# Patient Record
Sex: Female | Born: 1975 | Race: White | Hispanic: No | Smoking: Current every day smoker
Health system: Southern US, Community
[De-identification: ages and names within clinical notes are randomized; demographics above are authoritative.]

## PROBLEM LIST (undated history)

## (undated) DIAGNOSIS — F329 Major depressive disorder, single episode, unspecified: Secondary | ICD-10-CM

## (undated) DIAGNOSIS — F32A Depression, unspecified: Secondary | ICD-10-CM

## (undated) DIAGNOSIS — R51 Headache: Secondary | ICD-10-CM

## (undated) HISTORY — PX: TUBAL LIGATION: SHX77

## (undated) HISTORY — DX: Depression, unspecified: F32.A

## (undated) HISTORY — PX: OTHER SURGICAL HISTORY: SHX169

## (undated) HISTORY — DX: Major depressive disorder, single episode, unspecified: F32.9

---

## 2002-02-05 ENCOUNTER — Ambulatory Visit (HOSPITAL_COMMUNITY): Admission: RE | Admit: 2002-02-05 | Discharge: 2002-02-05 | Payer: Self-pay | Admitting: Gastroenterology

## 2005-01-14 ENCOUNTER — Ambulatory Visit (HOSPITAL_COMMUNITY): Admission: RE | Admit: 2005-01-14 | Discharge: 2005-01-14 | Payer: Self-pay | Admitting: Orthopedic Surgery

## 2005-01-14 ENCOUNTER — Ambulatory Visit (HOSPITAL_BASED_OUTPATIENT_CLINIC_OR_DEPARTMENT_OTHER): Admission: RE | Admit: 2005-01-14 | Discharge: 2005-01-14 | Payer: Self-pay | Admitting: Orthopedic Surgery

## 2008-08-15 ENCOUNTER — Ambulatory Visit (HOSPITAL_BASED_OUTPATIENT_CLINIC_OR_DEPARTMENT_OTHER): Admission: RE | Admit: 2008-08-15 | Discharge: 2008-08-15 | Payer: Self-pay | Admitting: Orthopedic Surgery

## 2010-07-10 NOTE — Op Note (Signed)
Becky Friedman, Becky Friedman                ACCOUNT NO.:  [id number]   MEDICAL RECORD NO.:  [id number]          PATIENT TYPE:  AMB   LOCATION:  DSC                          FACILITY:  [facility]   PHYSICIAN:  Feliberto Gottron. Turner Daniels, M.D.   DATE OF BIRTH:  08/19/1975   DATE OF PROCEDURE:  08/15/2008  DATE OF DISCHARGE:                               OPERATIVE REPORT   PREOPERATIVE DIAGNOSIS:  Right knee lateral meniscal tear.   POSTOPERATIVE DIAGNOSIS:  Right knee symptomatic plica.   PROCEDURE:  Right knee arthroscopic excision of plica.   SURGEON:  Feliberto Gottron. Turner Daniels, MD   FIRST ASSISTANT:  Shirl Harris, PA-C.   ANESTHETIC:  General LMA.   ESTIMATED BLOOD LOSS:  Minimal.   FLUID REPLACEMENT:  500 mL of crystalloid.   DRAINS PLACED:  None.   TOURNIQUET TIME:  None.   INDICATIONS FOR PROCEDURE:  The patient is a 35 year old woman with  symptomatic catching, popping, and pain in her right knee.  She has  failed conservative treatment with antiinflammatory medicines, attempts  at physical therapy and exercise.  Unfortunately, she has persistent  pain, catching, and popping in her right knee.  She desires arthroscopic  evaluation and treatment of same.  Risks and benefits of surgery have  been discussed and questions are answered.   DESCRIPTION OF PROCEDURE:  The patient was identified by armband and  taken to the operating room #2 at [facility].  Appropriate  anesthetic monitors were attached and general LMA anesthesia was induced  with the patient in the supine position.  Lateral post was applied to  the table and right lower extremity was prepped and draped in the usual  sterile fashion from the ankle to the mid thigh.  We then performed a  standard time-out procedure.  Using a #11 blade, standard inferomedial  and inferolateral peripatellar portals were then made allowing  introduction of the arthroscope through the inferolateral portal and the  outflow through the inferomedial  portal.  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  There was no evidence of any  significant chondromalacia or flap tears or any significant loose  bodies.  The gutters were cleared medially and laterally and the scope  was taken through the notch clearing the posterior compartments as well.  At this point, the knee was irrigated out with normal saline solution  and using a 3.5 Gator sucker shaver the superomedial plica was removed  and photographic documentation was made of this before and after the  removal of the plica.  The knee was again irrigated out with normal  saline solution.  The arthroscopic instruments were removed and a  dressing of Xeroform, 4 x 4 dressing  sponges, Webril, and Ace wrap was applied.  The patient was then  awakened, extubated, and taken to the recovery room without difficulty  and prior placement of the dressing a 5-10  mL of 0.5% Marcaine solution  were injected into the knee and another 2-3 mL in each portal.      Feliberto Gottron. Turner Daniels, M.D.  Electronically Signed     FJR/MEDQ  D:  08/15/2008  T:  08/16/2008  Job:  [id number]

## 2010-07-13 NOTE — Op Note (Signed)
   [name], PATIENT                            ACCOUNT NO.:  [id number]   MEDICAL RECORD NO.:  [id number]                   PATIENT TYPE:  AMB   LOCATION:  ENDO                                 FACILITY:  [facility]   PHYSICIAN:  Anselmo Rod, M.D.               DATE OF BIRTH:  Nov 27, 1975   DATE OF PROCEDURE:  02/05/2002  DATE OF DISCHARGE:                                 OPERATIVE REPORT   PROCEDURE:  Colonoscopy.   ENDOSCOPIST:  Charna Elizabeth, M.D.   INSTRUMENT USED:  Pediatric adjustable Olympus colonoscope.   INDICATIONS FOR PROCEDURE:  Rectal bleeding in a 35 year old white female  with change in bowel habits.  Rule out IBD.   PROCEDURE PERFORMED:  Informed consent was procured from the patient.  The  patient fasted for eight hours prior to the procedure and prepped with a  bottle of MiraLax and a bottle of Gatorade the night prior to the procedure.   PREPROCEDURE PHYSICAL EXAMINATION:  VITAL SIGNS:  The patient had stable  vital signs.  NECK:  Supple.  CHEST:  Clear to auscultation.  HEART:  S1 and S2 regular.  ABDOMEN:  Soft with normal bowel sounds.   DESCRIPTION OF PROCEDURE:  The patient was placed in the left lateral  decubitus position, sedated with 50 mg of Demerol and 5 mg of Versed  intravenously.  Once the patient was adequately sedated and maintained on  low flow oxygen and continuous cardiac monitoring, the Olympus video  colonoscope was advanced from the rectum to the cecum and terminal ileum.  A  few left-sided diverticula were seen.  Small internal hemorrhoids were seen  on retroflexion.  The rest of the colonic mucosa to the terminal ileum  appeared normal.  There was some residual stool in the colon.  Multiple  washings were done.   IMPRESSION:  1. Normal colon up to the terminal ileum except for nonbleeding internal     hemorrhoids.  2. A couple of left-sided diverticula were seen.  3. No evidence of inflammatory bowel disease.     RECOMMENDATIONS:  1. High fiber diet with liberal fluids has been advocated.  2. Outpatient follow up in the next two weeks for further recommendations.                                               Anselmo Rod, M.D.    JNM/MEDQ  D:  02/05/2002  T:  02/06/2002  Job:  [id number]   cc:   [facility]  [address]  [place]  [place] [address]  Fax: [phone]   Blenda Nicely, P.A.

## 2010-07-13 NOTE — Op Note (Signed)
Becky Friedman, Becky Friedman                ACCOUNT NO.:  [id number]   MEDICAL RECORD NO.:  [id number]          PATIENT TYPE:  AMB   LOCATION:  DSC                          FACILITY:  [facility]   PHYSICIAN:  Feliberto Gottron. Turner Daniels, M.D.   DATE OF BIRTH:  08/19/1975   DATE OF PROCEDURE:  01/14/2005  DATE OF DISCHARGE:                                 OPERATIVE REPORT   PREOPERATIVE DIAGNOSIS:  Left shoulder superior labral tear.   POSTOPERATIVE DIAGNOSIS:  Left shoulder superior labral tear.   PROCEDURE:  Left shoulder arthroscopic debridement of superior anterior  labral tear.   SURGEON:  Feliberto Gottron. Turner Daniels, M.D.   FIRST ASSISTANT:  Skip Mayer, P.A.-C.   ANESTHESIA:  Interscalene block with general endotracheal anesthesia.   ESTIMATED BLOOD LOSS:  Minimal.   FLUIDS REPLACED:  800 mL crystalloid.   DRAINS:  None.   TOURNIQUET TIME:  None.   INDICATIONS FOR PROCEDURE:  35 year old woman with catching, popping, and  pain in her left shoulder, got temporary relief from a cortisone injection,  and now the pain has come back.  MRI scan is consistent with a type 2 labral  tear and she is taken for arthroscopic evaluation and treatment of her left  shoulder.  The risks and benefits of the surgery were discussed before the  procedure and questions answered.   DESCRIPTION OF PROCEDURE:  The patient was identified by arm band and taken  to the operating room at [facility] after the induction of left  interscalene block anesthesia.  Appropriate anesthetic monitors were  attached and general endotracheal anesthesia induced with the patient in the  supine position.  She was then placed in the beach chair position and the  left upper extremity was prepped and draped in the usual sterile fashion  from the wrist to the hemithorax.  Using a #11 blade, a standard  posterolateral portal was made 1.5 cm inferior to the posterolateral corner  of the acromion and the arthroscope introduced into the  glenohumeral joint  with the in flow attached.  Diagnostic arthroscopy revealed a small tear at  the superior anterior aspect of the labrum where there was some fraying of  the leading edge of the labrum, however, it was not peeled off the glenoid  neck.  The biceps anchor was intact and the posterior superior labrum was  found to be intact, as well.  We used an anterior portal 1.5 cm anterior to  the [facility] joint to place the probe and also to swap and put the scope in it  from anterior looking at the posterior labrum, as well.  The articular  cartilage was in excellent condition as was the subscapularis tendon, the  supraspinatus tendon, and the infraspinatus tendon.  The articular cartilage  was in excellent condition, as well.  The superior labral tear was debrided  back to a stable margin with a 3.5 Gator sucker shaver and at this point,  the  shoulder was then irrigated out with normal saline solution.  The  arthroscopic instruments were removed and a dressing of Xeroform, 4 by  4  dressing sponges, paper tape, and sling applied.  The patient was laid  supine, awakened, and taken to the recovery room without difficulty.      Feliberto Gottron. Turner Daniels, M.D.  Electronically Signed     FJR/MEDQ  D:  01/14/2005  T:  01/14/2005  Job:  [id number]

## 2011-07-17 ENCOUNTER — Ambulatory Visit: Payer: Self-pay | Admitting: Physician Assistant

## 2011-07-31 ENCOUNTER — Encounter: Payer: Self-pay | Admitting: Physician Assistant

## 2011-07-31 ENCOUNTER — Ambulatory Visit (INDEPENDENT_AMBULATORY_CARE_PROVIDER_SITE_OTHER): Payer: 59 | Admitting: Physician Assistant

## 2011-07-31 VITALS — BP 110/70 | HR 66 | Ht 62.0 in | Wt 135.0 lb

## 2011-07-31 DIAGNOSIS — R10814 Left lower quadrant abdominal tenderness: Secondary | ICD-10-CM

## 2011-07-31 DIAGNOSIS — N946 Dysmenorrhea, unspecified: Secondary | ICD-10-CM

## 2011-07-31 DIAGNOSIS — F329 Major depressive disorder, single episode, unspecified: Secondary | ICD-10-CM

## 2011-07-31 DIAGNOSIS — Z1239 Encounter for other screening for malignant neoplasm of breast: Secondary | ICD-10-CM

## 2011-07-31 DIAGNOSIS — R10813 Right lower quadrant abdominal tenderness: Secondary | ICD-10-CM

## 2011-07-31 DIAGNOSIS — N920 Excessive and frequent menstruation with regular cycle: Secondary | ICD-10-CM

## 2011-07-31 DIAGNOSIS — G43009 Migraine without aura, not intractable, without status migrainosus: Secondary | ICD-10-CM

## 2011-07-31 MED ORDER — SUMATRIPTAN SUCCINATE 100 MG PO TABS: 100.0000 mg | ORAL_TABLET | ORAL | Status: DC | PRN

## 2011-07-31 MED ORDER — TRAMADOL HCL 50 MG PO TABS: 50.0000 mg | ORAL_TABLET | Freq: Two times a day (BID) | ORAL | Status: AC

## 2011-07-31 NOTE — Patient Instructions (Addendum)
BRAC test was done today. Will get results from company and we will call. Will schedule pelvic ultrasound for pain. Should be hearing about an appt for pelvic ultrasound and mammogram. If do not hear from [place] call in one week. Gave Imitrex 100mg  to take at onset of headache and then 2 hr later if needed do not exceed 2 doses in 24 hour period. Will give tramadol for cramp pain. Schedule CPE/pap smear in 6 weeks.   Do not exceed Aleve 1100 mg in a day.

## 2011-07-31 NOTE — Progress Notes (Signed)
  Subjective:    Patient ID: Becky Friedman, female    DOB: March 14, 1975, 36 y.o.   MRN: [id number]  HPI Patient presents to the clinic to establish care. PMH reviewed. She has a hx of depression and took effexor at one point. She took herself off effexor in September and has been fairly controlled. She still have bouts of downness but does not control life. No thoughts of suicidal or harm towards others. She does wish to have CPE/PAP in the near future.   She does have one major concern today of heavy, long, irregular, very painful menstrual cycles. She has had these that have gotten increasingly worse for the last year. Las pap normal in 2012. Cycle will last for 7-10 days and bring her to tears with pain and cramping. She states that she has taken everything OTC that she can think of and does help the pain. She has be taking 3 aleve sometimes twice a day and still doesn't touch it. She also notices that her mood and depression is worse around cycle time. She denies a hx of fibroids or endometrosis. She denies any GI symptoms and bowel movements are normal.   She also has migraines associated with headaches. She has used Excedrin migraine in the past and worked but she is finding it hard to find this med anymore. Never tried anything prescription. Migraines are unlateral usually behind one or both eyes involve light sensitive and vision blurrness sometimes. She is nauseated but usually does not vomit. They occur around menstrual cycle.   Needs mammogram. Mother had breast cancer and died at 21.      Review of Systems     Objective:   Physical Exam  Constitutional: She is oriented to person, place, and time. She appears well-developed and well-nourished.  HENT:  Head: Normocephalic and atraumatic.  Cardiovascular: Normal rate, regular rhythm and normal heart sounds.   Pulmonary/Chest: Effort normal and breath sounds normal.  Abdominal: Soft. Bowel sounds are normal. There is tenderness.   Lower left and right abdominal tenderness to deep palpation. No rebound tenderness.  Neurological: She is alert and oriented to person, place, and time.  Skin: Skin is warm and dry.  Psychiatric: She has a normal mood and affect. Her behavior is normal.          Assessment & Plan:  Dysmenorrhea/heavy menstrual cycle-Will get pelvic and transvaginal ultrasound to evalutate for fibroids or endometrosis. Gave tramadol to only use with menstrual cramp/pain during cycle.   Migraine- will give Imitrex to use at onset of headache. Will follow up on efficacy in 6 weeks for CPE/Pap.   Depression- Well controlled today without medication. Patient is advised to exercise and eat well balanced diet in order to keep serotonin levels up and feel happier. Call office if any worsening.   Family hx of breast cancer-Will order mammogram for screening. Discussed BRAC testing today and how this might be beneficial for her to know. She agreed and test was performed and sent to lab. Will call with results.   Pap in 6 weeks. Advised to be fasting so we could get labs.

## 2011-08-02 DIAGNOSIS — F329 Major depressive disorder, single episode, unspecified: Secondary | ICD-10-CM | POA: Insufficient documentation

## 2011-08-07 ENCOUNTER — Ambulatory Visit
Admission: RE | Admit: 2011-08-07 | Discharge: 2011-08-07 | Disposition: A | Discharge status: Home / Self Care | Payer: 59 | Source: Ambulatory Visit | Attending: Physician Assistant | Admitting: Physician Assistant

## 2011-08-07 DIAGNOSIS — N920 Excessive and frequent menstruation with regular cycle: Secondary | ICD-10-CM

## 2011-08-07 DIAGNOSIS — N946 Dysmenorrhea, unspecified: Secondary | ICD-10-CM

## 2011-08-07 DIAGNOSIS — R10813 Right lower quadrant abdominal tenderness: Secondary | ICD-10-CM

## 2011-08-07 DIAGNOSIS — R10814 Left lower quadrant abdominal tenderness: Secondary | ICD-10-CM

## 2011-08-08 ENCOUNTER — Telehealth: Payer: Self-pay | Admitting: Physician Assistant

## 2011-08-08 DIAGNOSIS — N83202 Unspecified ovarian cyst, left side: Secondary | ICD-10-CM

## 2011-08-08 NOTE — Telephone Encounter (Signed)
Patient was called and results were given to patient. She wants to precede with referral to surgeon. Will refer today.

## 2011-08-14 ENCOUNTER — Ambulatory Visit (INDEPENDENT_AMBULATORY_CARE_PROVIDER_SITE_OTHER): Payer: 59 | Admitting: Obstetrics & Gynecology

## 2011-08-14 ENCOUNTER — Encounter: Payer: Self-pay | Admitting: Obstetrics & Gynecology

## 2011-08-14 VITALS — BP 104/69 | HR 75 | Temp 98.6°F | Resp 16 | Ht 62.0 in | Wt 135.0 lb

## 2011-08-14 DIAGNOSIS — N920 Excessive and frequent menstruation with regular cycle: Secondary | ICD-10-CM

## 2011-08-14 DIAGNOSIS — N83209 Unspecified ovarian cyst, unspecified side: Secondary | ICD-10-CM

## 2011-08-14 DIAGNOSIS — N921 Excessive and frequent menstruation with irregular cycle: Secondary | ICD-10-CM

## 2011-08-14 DIAGNOSIS — N83202 Unspecified ovarian cyst, left side: Secondary | ICD-10-CM | POA: Insufficient documentation

## 2011-08-14 NOTE — Addendum Note (Signed)
Addended by: Granville Lewis on: 08/14/2011 11:17 AM   Modules accepted: Orders

## 2011-08-14 NOTE — Progress Notes (Signed)
Subjective:     Patient ID: Becky Friedman, female   DOB: 1976-02-10, 36 y.o.   MRN: [id number]  HPI Pt presents for consultation of 7 cm mildly complex ovarian cyst.  Pt also having irregular periods for approx 1 year.  Cycles are 21-40 days with intra-menstural spotting.  +dysmenorrhea.  Pt has some cramping throughout month.  Pt's family history is significant for mother dying of breast cancre at age 69.  Pt has not had BRCA testing yet--she is waiting to hear if her insurance will cover it.  She is not in close contact with her sister so it is unknown if she has breast or ovarian cancer.  Review of Systems  Constitutional: Negative.   Respiratory: Negative.   Genitourinary: Positive for vaginal bleeding, menstrual problem and pelvic pain.  Psychiatric/Behavioral: Negative.    Past medical history:I have reviewed and confirmed the past medical history in the chart. Medications: reviewed medication list in the chart Allergies: reviewed allergy section in the chart     Objective:   Physical Exam  Vitals reviewed. Constitutional: She is oriented to person, place, and time. She appears well-developed and well-nourished. No distress.  HENT:  Head: Normocephalic and atraumatic.  Eyes: Conjunctivae are normal.  Pulmonary/Chest: Breath sounds normal.  Abdominal: Soft. She exhibits no distension. There is no tenderness. There is no rebound and no guarding.  Genitourinary: Vagina normal and uterus normal. No vaginal discharge found.       Left ovary enlarged and mobile.  Nontender.  Right ovary WNL.  Uterus anteverted and nontender.  Neurological: She is alert and oriented to person, place, and time.  Skin: Skin is warm and dry.  Psychiatric: She has a normal mood and affect.   Left ovary: In the left ovary is a 7 x 6 x 8 cm cystic mass which  is predominately anechoic. There are several echogenic foci along  the wall of the cyst. Suggestion of a few tiny particles of debris  floating within  the cyst. Possible thin septation along the  periphery of the cyst on image number 48. No vascular flow is seen  within the cyst. There is vascular flow is seen within the  surrounding ovarian parenchyma on image number 48.    Assessment:     33 G2P2 female with 7 cm mildly complex left ovarian cyst and menometrorrhagia.  Family history of premenopausal breast cancer (mother died age 51)    Plan:     Endometrial biopsy CA 125 Phone consult with gyn oncology Will call with plan.

## 2011-08-14 NOTE — Addendum Note (Signed)
Addended by: Granville Lewis on: 08/14/2011 11:21 AM   Modules accepted: Orders

## 2011-08-15 LAB — CA 125: CA 125: 8.8 U/mL (ref 0.0–30.2)

## 2011-08-20 ENCOUNTER — Ambulatory Visit (INDEPENDENT_AMBULATORY_CARE_PROVIDER_SITE_OTHER): Payer: 59 | Admitting: Obstetrics & Gynecology

## 2011-08-20 ENCOUNTER — Encounter: Payer: Self-pay | Admitting: Obstetrics & Gynecology

## 2011-08-20 DIAGNOSIS — N83299 Other ovarian cyst, unspecified side: Secondary | ICD-10-CM

## 2011-08-20 DIAGNOSIS — N83209 Unspecified ovarian cyst, unspecified side: Secondary | ICD-10-CM

## 2011-08-20 DIAGNOSIS — N946 Dysmenorrhea, unspecified: Secondary | ICD-10-CM

## 2011-08-20 DIAGNOSIS — N949 Unspecified condition associated with female genital organs and menstrual cycle: Secondary | ICD-10-CM

## 2011-08-20 DIAGNOSIS — N938 Other specified abnormal uterine and vaginal bleeding: Secondary | ICD-10-CM

## 2011-08-20 NOTE — Progress Notes (Signed)
  Subjective:    Patient ID: Becky Friedman, female    DOB: 23-Sep-1975, 36 y.o.   MRN: [id number]  HPI  Pt presents for results and plan.  CA 125 = 8.  [place] has been reveiwed and has a 8 cm left ovarian cyst with minimal complexity.  Pt also has severe dysmenorrhea that causes her to miss work each month.  Pt also having irregular periods for approx 1 year. Cycles are 21-40 days with intra-menstural spotting. Pt has some cramping throughout month. Pt's family history is significant for mother dying of breast cancre at age 57.  Pt is getting BRCA testing today.  Pt needs removal of ovary and would also like definitive surgical management of her irregular menses and dysmenorrhea.  Pt does not want to try any conservative options.  Pt consented for laparoscopic Left oophorectomy, and supracervical hysterectomy.  Pt has had all nml pap smears her entire life.  If BRCA 1 or 2 is positive, then she will have a BSO.  Risks include but not limited to bleeding requiring a blood transfusion, infection, damage to intraabdominal organs (bowel, bladder, ureters), DVT, pulmonary embolism, anesthetic complications.  Laparotomy is also a possibility if unsuspected pathology is encountered or there are complications.   Review of Systems  Constitutional: Negative.   Respiratory: Negative.   Cardiovascular: Negative.   Gastrointestinal: Negative.   Genitourinary: Negative.        Objective:   Physical Exam  Vitals reviewed. Constitutional: She appears well-developed and well-nourished. No distress.  Pulmonary/Chest: Effort normal.  Abdominal: Soft.  Skin: Skin is warm and dry.  Psychiatric: She has a normal mood and affect.          Assessment & Plan:  36 year old female with 8 cm ovarian cyst with minimal complexity, DUB, and dysmenorrhea.  Scheduled for LSH, LSO on 7/22 Will follow up on BRCA Endometrial biopsy is negative.

## 2011-08-26 ENCOUNTER — Encounter (HOSPITAL_COMMUNITY): Payer: Self-pay | Admitting: Pharmacist

## 2011-08-27 ENCOUNTER — Ambulatory Visit (INDEPENDENT_AMBULATORY_CARE_PROVIDER_SITE_OTHER): Payer: 59

## 2011-08-27 ENCOUNTER — Ambulatory Visit: Payer: 59

## 2011-08-27 DIAGNOSIS — Z1239 Encounter for other screening for malignant neoplasm of breast: Secondary | ICD-10-CM

## 2011-09-03 ENCOUNTER — Other Ambulatory Visit: Payer: Self-pay | Admitting: Physician Assistant

## 2011-09-03 DIAGNOSIS — R928 Other abnormal and inconclusive findings on diagnostic imaging of breast: Secondary | ICD-10-CM

## 2011-09-04 ENCOUNTER — Encounter (HOSPITAL_COMMUNITY)
Admission: RE | Admit: 2011-09-04 | Discharge: 2011-09-04 | Disposition: A | Discharge status: Home / Self Care | Payer: 59 | Source: Ambulatory Visit | Attending: Obstetrics & Gynecology | Admitting: Obstetrics & Gynecology

## 2011-09-04 ENCOUNTER — Encounter (HOSPITAL_COMMUNITY): Payer: Self-pay

## 2011-09-04 HISTORY — DX: Headache: R51

## 2011-09-04 LAB — CBC
HCT: 38.9 % (ref 36.0–46.0)
Hemoglobin: 12.9 g/dL (ref 12.0–15.0)
MCHC: 33.2 g/dL (ref 30.0–36.0)

## 2011-09-04 LAB — SURGICAL PCR SCREEN: Staphylococcus aureus: POSITIVE — AB

## 2011-09-04 NOTE — Patient Instructions (Addendum)
20 Becky Friedman  09/04/2011   Your procedure is scheduled on:  09/16/11  Enter through the Main Entrance of [facility] at 915 AM.  Pick up the phone at the desk and dial [phone].   Call this number if you have problems the morning of surgery: [phone]   Remember:   Do not eat food:After Midnight.  Do not drink clear liquids: After Midnight.  Take these medicines the morning of surgery with A SIP OF WATER: NA   Do not wear jewelry, make-up or nail polish.  Do not wear lotions, powders, or perfumes. You may wear deodorant.  Do not shave 48 hours prior to surgery.  Do not bring valuables to the hospital.  Contacts, dentures or bridgework may not be worn into surgery.  Leave suitcase in the car. After surgery it may be brought to your room.  For patients admitted to the hospital, checkout time is 11:00 AM the day of discharge.   Patients discharged the day of surgery will not be allowed to drive home.  Name and phone number of your driver: NA  Special Instructions: CHG Shower Use Special Wash: 1/2 bottle night before surgery and 1/2 bottle morning of surgery.   Please read over the following fact sheets that you were given: MRSA Information

## 2011-09-09 ENCOUNTER — Ambulatory Visit
Admission: RE | Admit: 2011-09-09 | Discharge: 2011-09-09 | Disposition: A | Discharge status: Home / Self Care | Payer: 59 | Source: Ambulatory Visit | Attending: Physician Assistant | Admitting: Physician Assistant

## 2011-09-09 ENCOUNTER — Other Ambulatory Visit: Payer: Self-pay | Admitting: Physician Assistant

## 2011-09-09 ENCOUNTER — Other Ambulatory Visit: Payer: 59

## 2011-09-09 ENCOUNTER — Encounter: Payer: Self-pay | Admitting: *Deleted

## 2011-09-09 DIAGNOSIS — R928 Other abnormal and inconclusive findings on diagnostic imaging of breast: Secondary | ICD-10-CM

## 2011-09-11 ENCOUNTER — Encounter: Payer: 59 | Admitting: Physician Assistant

## 2011-09-15 MED ORDER — CEFAZOLIN SODIUM-DEXTROSE 2-3 GM-% IV SOLR: 2.0000 g | INTRAVENOUS | Status: DC

## 2011-09-16 ENCOUNTER — Encounter (HOSPITAL_COMMUNITY): Payer: Self-pay | Admitting: Anesthesiology

## 2011-09-16 ENCOUNTER — Encounter (HOSPITAL_COMMUNITY)
Admission: RE | Disposition: A | Discharge status: Home / Self Care | Payer: Self-pay | Source: Ambulatory Visit | Attending: Obstetrics & Gynecology

## 2011-09-16 ENCOUNTER — Ambulatory Visit (HOSPITAL_COMMUNITY)
Admission: RE | Admit: 2011-09-16 | Discharge: 2011-09-17 | Disposition: A | Discharge status: Home / Self Care | Payer: 59 | Source: Ambulatory Visit | Attending: Obstetrics & Gynecology | Admitting: Obstetrics & Gynecology

## 2011-09-16 ENCOUNTER — Encounter (HOSPITAL_COMMUNITY): Payer: Self-pay | Admitting: *Deleted

## 2011-09-16 ENCOUNTER — Ambulatory Visit (HOSPITAL_COMMUNITY): Payer: 59 | Admitting: Anesthesiology

## 2011-09-16 DIAGNOSIS — D279 Benign neoplasm of unspecified ovary: Secondary | ICD-10-CM | POA: Insufficient documentation

## 2011-09-16 DIAGNOSIS — N946 Dysmenorrhea, unspecified: Secondary | ICD-10-CM

## 2011-09-16 DIAGNOSIS — N83209 Unspecified ovarian cyst, unspecified side: Secondary | ICD-10-CM | POA: Insufficient documentation

## 2011-09-16 DIAGNOSIS — N83202 Unspecified ovarian cyst, left side: Secondary | ICD-10-CM

## 2011-09-16 DIAGNOSIS — G43909 Migraine, unspecified, not intractable, without status migrainosus: Secondary | ICD-10-CM | POA: Insufficient documentation

## 2011-09-16 DIAGNOSIS — N921 Excessive and frequent menstruation with irregular cycle: Secondary | ICD-10-CM

## 2011-09-16 DIAGNOSIS — N920 Excessive and frequent menstruation with regular cycle: Secondary | ICD-10-CM

## 2011-09-16 DIAGNOSIS — D251 Intramural leiomyoma of uterus: Secondary | ICD-10-CM | POA: Insufficient documentation

## 2011-09-16 HISTORY — PX: LAPAROSCOPIC SUPRACERVICAL HYSTERECTOMY: SHX5399

## 2011-09-16 HISTORY — PX: SALPINGOOPHORECTOMY: SHX82

## 2011-09-16 SURGERY — HYSTERECTOMY, SUPRACERVICAL, LAPAROSCOPIC
Anesthesia: General | Site: Abdomen | Wound class: Clean Contaminated

## 2011-09-16 MED ORDER — KETOROLAC TROMETHAMINE 30 MG/ML IJ SOLN
15.0000 mg | Freq: Once | INTRAMUSCULAR | Status: AC | PRN
  Administered 2011-09-16: 30 mg via INTRAVENOUS

## 2011-09-16 MED ORDER — MIDAZOLAM HCL 2 MG/2ML IJ SOLN
INTRAMUSCULAR | Status: AC
  Filled 2011-09-16: qty 2

## 2011-09-16 MED ORDER — FENTANYL CITRATE 0.05 MG/ML IJ SOLN
INTRAMUSCULAR | Status: DC | PRN
  Administered 2011-09-16: 50 ug via INTRAVENOUS
  Administered 2011-09-16 (×2): 100 ug via INTRAVENOUS

## 2011-09-16 MED ORDER — HYDROMORPHONE HCL PF 1 MG/ML IJ SOLN
INTRAMUSCULAR | Status: AC
  Filled 2011-09-16: qty 1

## 2011-09-16 MED ORDER — FENTANYL CITRATE 0.05 MG/ML IJ SOLN
INTRAMUSCULAR | Status: AC
  Administered 2011-09-16: 50 ug via INTRAVENOUS
  Filled 2011-09-16: qty 2

## 2011-09-16 MED ORDER — MENTHOL 3 MG MT LOZG: 1.0000 | LOZENGE | OROMUCOSAL | Status: DC | PRN

## 2011-09-16 MED ORDER — ONDANSETRON HCL 4 MG/2ML IJ SOLN: 4.0000 mg | Freq: Four times a day (QID) | INTRAMUSCULAR | Status: DC | PRN

## 2011-09-16 MED ORDER — GLYCOPYRROLATE 0.2 MG/ML IJ SOLN
INTRAMUSCULAR | Status: AC
  Filled 2011-09-16: qty 1

## 2011-09-16 MED ORDER — CEFAZOLIN SODIUM-DEXTROSE 2-3 GM-% IV SOLR
INTRAVENOUS | Status: AC
  Administered 2011-09-16: 2 g via INTRAVENOUS
  Filled 2011-09-16: qty 50

## 2011-09-16 MED ORDER — SUMATRIPTAN SUCCINATE 100 MG PO TABS
100.0000 mg | ORAL_TABLET | ORAL | Status: DC | PRN
  Filled 2011-09-16: qty 1

## 2011-09-16 MED ORDER — ROCURONIUM BROMIDE 100 MG/10ML IV SOLN
INTRAVENOUS | Status: DC | PRN
  Administered 2011-09-16: 40 mg via INTRAVENOUS

## 2011-09-16 MED ORDER — MEPERIDINE HCL 25 MG/ML IJ SOLN: 6.2500 mg | INTRAMUSCULAR | Status: DC | PRN

## 2011-09-16 MED ORDER — MIDAZOLAM HCL 2 MG/2ML IJ SOLN: 0.5000 mg | Freq: Once | INTRAMUSCULAR | Status: DC | PRN

## 2011-09-16 MED ORDER — DIPHENHYDRAMINE HCL 12.5 MG/5ML PO ELIX: 12.5000 mg | ORAL_SOLUTION | Freq: Four times a day (QID) | ORAL | Status: DC | PRN

## 2011-09-16 MED ORDER — FENTANYL CITRATE 0.05 MG/ML IJ SOLN
25.0000 ug | INTRAMUSCULAR | Status: DC | PRN
  Administered 2011-09-16: 50 ug via INTRAVENOUS

## 2011-09-16 MED ORDER — ONDANSETRON HCL 4 MG PO TABS: 4.0000 mg | ORAL_TABLET | Freq: Four times a day (QID) | ORAL | Status: DC | PRN

## 2011-09-16 MED ORDER — OXYCODONE-ACETAMINOPHEN 5-325 MG PO TABS
1.0000 | ORAL_TABLET | ORAL | Status: DC | PRN
  Administered 2011-09-17: 2 via ORAL
  Filled 2011-09-16: qty 2

## 2011-09-16 MED ORDER — PROPOFOL 10 MG/ML IV EMUL
INTRAVENOUS | Status: AC
  Filled 2011-09-16: qty 20

## 2011-09-16 MED ORDER — ONDANSETRON HCL 4 MG/2ML IJ SOLN
INTRAMUSCULAR | Status: AC
  Filled 2011-09-16: qty 2

## 2011-09-16 MED ORDER — LACTATED RINGERS IV SOLN
INTRAVENOUS | Status: DC
  Administered 2011-09-16 (×2): via INTRAVENOUS

## 2011-09-16 MED ORDER — FENTANYL CITRATE 0.05 MG/ML IJ SOLN
INTRAMUSCULAR | Status: AC
  Filled 2011-09-16: qty 2

## 2011-09-16 MED ORDER — DEXAMETHASONE SODIUM PHOSPHATE 10 MG/ML IJ SOLN
INTRAMUSCULAR | Status: AC
  Filled 2011-09-16: qty 1

## 2011-09-16 MED ORDER — GLYCOPYRROLATE 0.2 MG/ML IJ SOLN
INTRAMUSCULAR | Status: DC | PRN
  Administered 2011-09-16: 0.4 mg via INTRAVENOUS

## 2011-09-16 MED ORDER — HYDROMORPHONE HCL PF 1 MG/ML IJ SOLN
INTRAMUSCULAR | Status: DC | PRN
  Administered 2011-09-16 (×2): 1 mg via INTRAVENOUS

## 2011-09-16 MED ORDER — PROMETHAZINE HCL 25 MG/ML IJ SOLN: 6.2500 mg | INTRAMUSCULAR | Status: DC | PRN

## 2011-09-16 MED ORDER — HYDROMORPHONE 0.3 MG/ML IV SOLN
INTRAVENOUS | Status: DC
  Administered 2011-09-16: 14:00:00 via INTRAVENOUS
  Administered 2011-09-16: 1.59 mg via INTRAVENOUS
  Administered 2011-09-17: 1.19 mg via INTRAVENOUS
  Administered 2011-09-17: 0.2 mg via INTRAVENOUS
  Filled 2011-09-16: qty 25

## 2011-09-16 MED ORDER — ONDANSETRON HCL 4 MG/2ML IJ SOLN
INTRAMUSCULAR | Status: DC | PRN
  Administered 2011-09-16: 4 mg via INTRAVENOUS

## 2011-09-16 MED ORDER — KETOROLAC TROMETHAMINE 30 MG/ML IJ SOLN
INTRAMUSCULAR | Status: AC
  Administered 2011-09-16: 30 mg via INTRAVENOUS
  Filled 2011-09-16: qty 1

## 2011-09-16 MED ORDER — SODIUM CHLORIDE 0.9 % IJ SOLN: 9.0000 mL | INTRAMUSCULAR | Status: DC | PRN

## 2011-09-16 MED ORDER — LACTATED RINGERS IR SOLN
Status: DC | PRN
  Administered 2011-09-16: 3000 mL

## 2011-09-16 MED ORDER — BUPIVACAINE HCL (PF) 0.25 % IJ SOLN
INTRAMUSCULAR | Status: AC
  Filled 2011-09-16: qty 30

## 2011-09-16 MED ORDER — BUPIVACAINE HCL (PF) 0.25 % IJ SOLN
INTRAMUSCULAR | Status: DC | PRN
  Administered 2011-09-16: 10 mL

## 2011-09-16 MED ORDER — DIPHENHYDRAMINE HCL 50 MG/ML IJ SOLN: 12.5000 mg | Freq: Four times a day (QID) | INTRAMUSCULAR | Status: DC | PRN

## 2011-09-16 MED ORDER — LACTATED RINGERS IV SOLN
INTRAVENOUS | Status: DC
  Administered 2011-09-16 – 2011-09-17 (×2): via INTRAVENOUS

## 2011-09-16 MED ORDER — LIDOCAINE HCL (CARDIAC) 20 MG/ML IV SOLN
INTRAVENOUS | Status: AC
  Filled 2011-09-16: qty 5

## 2011-09-16 MED ORDER — NEOSTIGMINE METHYLSULFATE 1 MG/ML IJ SOLN
INTRAMUSCULAR | Status: DC | PRN
  Administered 2011-09-16: 3 mg via INTRAVENOUS

## 2011-09-16 MED ORDER — FENTANYL CITRATE 0.05 MG/ML IJ SOLN
INTRAMUSCULAR | Status: AC
  Filled 2011-09-16: qty 5

## 2011-09-16 MED ORDER — DEXAMETHASONE SODIUM PHOSPHATE 4 MG/ML IJ SOLN
INTRAMUSCULAR | Status: DC | PRN
  Administered 2011-09-16: 8 mg via INTRAVENOUS

## 2011-09-16 MED ORDER — MIDAZOLAM HCL 5 MG/5ML IJ SOLN
INTRAMUSCULAR | Status: DC | PRN
  Administered 2011-09-16: 2 mg via INTRAVENOUS

## 2011-09-16 MED ORDER — KETOROLAC TROMETHAMINE 30 MG/ML IJ SOLN: 30.0000 mg | Freq: Four times a day (QID) | INTRAMUSCULAR | Status: DC

## 2011-09-16 MED ORDER — NEOSTIGMINE METHYLSULFATE 1 MG/ML IJ SOLN
INTRAMUSCULAR | Status: AC
  Filled 2011-09-16: qty 10

## 2011-09-16 MED ORDER — PROPOFOL 10 MG/ML IV EMUL
INTRAVENOUS | Status: DC | PRN
  Administered 2011-09-16: 200 mg via INTRAVENOUS

## 2011-09-16 MED ORDER — NALOXONE HCL 0.4 MG/ML IJ SOLN: 0.4000 mg | INTRAMUSCULAR | Status: DC | PRN

## 2011-09-16 MED ORDER — ACETAMINOPHEN 325 MG PO TABS: 650.0000 mg | ORAL_TABLET | ORAL | Status: DC | PRN

## 2011-09-16 MED ORDER — KETOROLAC TROMETHAMINE 30 MG/ML IJ SOLN
30.0000 mg | Freq: Four times a day (QID) | INTRAMUSCULAR | Status: DC
  Administered 2011-09-17 (×2): 30 mg via INTRAVENOUS
  Filled 2011-09-16 (×2): qty 1

## 2011-09-16 MED ORDER — LACTATED RINGERS IV SOLN: INTRAVENOUS | Status: DC

## 2011-09-16 MED ORDER — LIDOCAINE HCL (CARDIAC) 20 MG/ML IV SOLN
INTRAVENOUS | Status: DC | PRN
  Administered 2011-09-16: 100 mg via INTRAVENOUS

## 2011-09-16 MED ORDER — ROCURONIUM BROMIDE 50 MG/5ML IV SOLN
INTRAVENOUS | Status: AC
  Filled 2011-09-16: qty 1

## 2011-09-16 SURGICAL SUPPLY — 33 items
ADH SKN CLS APL DERMABOND .7 (GAUZE/BANDAGES/DRESSINGS) ×3
BAG SPEC RTRVL LRG 6X4 10 (ENDOMECHANICALS) ×3
BARRIER ADHS 3X4 INTERCEED (GAUZE/BANDAGES/DRESSINGS) ×4 IMPLANT
BLADE LAPAROSCOPIC MORCELL KIT (BLADE) IMPLANT
BLADE SURG 15 STRL LF C SS BP (BLADE) ×3 IMPLANT
BLADE SURG 15 STRL SS (BLADE) ×4
BRR ADH 4X3 ABS CNTRL BYND (GAUZE/BANDAGES/DRESSINGS) ×3
CANISTER SUCTION 2500CC (MISCELLANEOUS) ×8 IMPLANT
CHLORAPREP W/TINT 26ML (MISCELLANEOUS) ×4 IMPLANT
CLOTH BEACON ORANGE TIMEOUT ST (SAFETY) ×4 IMPLANT
CONT PATH 16OZ SNAP LID 3702 (MISCELLANEOUS) ×4 IMPLANT
COVER MAYO STAND STRL (DRAPES) ×4 IMPLANT
DERMABOND ADVANCED (GAUZE/BANDAGES/DRESSINGS) ×1
DERMABOND ADVANCED .7 DNX12 (GAUZE/BANDAGES/DRESSINGS) ×3 IMPLANT
EVACUATOR SMOKE 8.L (FILTER) ×4 IMPLANT
GLOVE BIO SURGEON STRL SZ7 (GLOVE) ×4 IMPLANT
GLOVE BIOGEL PI IND STRL 7.0 (GLOVE) ×6 IMPLANT
GLOVE BIOGEL PI INDICATOR 7.0 (GLOVE) ×2
GOWN PREVENTION PLUS LG XLONG (DISPOSABLE) ×12 IMPLANT
HEMOSTAT SURGICEL 2X3 (HEMOSTASIS) ×4 IMPLANT
NS IRRIG 1000ML POUR BTL (IV SOLUTION) ×4 IMPLANT
PACK LAVH (CUSTOM PROCEDURE TRAY) ×4 IMPLANT
POUCH SPECIMEN RETRIEVAL 10MM (ENDOMECHANICALS) ×4 IMPLANT
SCALPEL HARMONIC ACE (MISCELLANEOUS) ×8 IMPLANT
SET IRRIG TUBING LAPAROSCOPIC (IRRIGATION / IRRIGATOR) ×4 IMPLANT
SUT VICRYL 0 UR6 27IN ABS (SUTURE) ×4 IMPLANT
SUT VICRYL RAPIDE 3 0 (SUTURE) ×4 IMPLANT
TOWEL OR 17X24 6PK STRL BLUE (TOWEL DISPOSABLE) ×8 IMPLANT
TRAY FOLEY CATH 14FR (SET/KITS/TRAYS/PACK) ×4 IMPLANT
TROCAR BLADELESS 15MM (ENDOMECHANICALS) ×4 IMPLANT
TROCAR Z-THREAD FIOS 11X100 BL (TROCAR) ×4 IMPLANT
WARMER LAPAROSCOPE (MISCELLANEOUS) ×4 IMPLANT
WATER STERILE IRR 1000ML POUR (IV SOLUTION) ×4 IMPLANT

## 2011-09-16 NOTE — Op Note (Signed)
Becky Friedman PROCEDURE DATE: 09/16/2011  PREOPERATIVE DIAGNOSIS: Left ovarian cyst with minimal complex features, metrorrhagia, dysmenorrhea  POSTOPERATIVE DIAGNOSIS: The same PROCEDURE: Laparoscopic supracervical hysterectomy left salpingoophrectomy SURGEON:  Dr. Elsie Lincoln ASSISTANT: Dr, Nicholaus Bloom  INDICATIONS: 36 y.o. with minimally complex left ovarian cyst (nml CA 125) and metrorrhagia and dysmenorrhea.  She has no history of cervical dysplasia.  Endometrial biopsy is negative.  Risks of surgery were discussed with the patient including but not limited to: bleeding which may require transfusion or reoperation; infection which may require antibiotics; injury to bowel, bladder, ureters or other surrounding organs; need for additional procedures including laparotomy; thromboembolic phenomenon, incisional problems and other postoperative/anesthesia complications. Written informed consent was obtained.    FINDINGS:  Normal sized uterus, 9 cm left ovarian cyst (negative frozen section), fallopian tubes transected, normal right ovary.  No evidence of endometriosis, adhesions or any other abdominal/pelvic abnormality.  Normal upper abdomen.  ANESTHESIA:    General ESTIMATED BLOOD LOSS: 100 ml SPECIMENS: Uterine corpus, left fallopian tube, left ovary COMPLICATIONS: None immediate  PROCEDURE IN DETAIL:  The patient received intravenous antibiotics and had sequential compression devices applied to her lower extremities while in the preoperative area.  She was then taken to the operating room where general anesthesia was administered and was found to be adequate.  She was placed in the dorsal lithotomy position, and was prepped and draped in a sterile manner.  A Foley catheter was inserted into her bladder and attached to constant drainage and a uterine manipulator was then advanced into the uterus .  After an adequate timeout was performed, attention was then turned to the patient's abdomen where a  10-mm skin incision was made in the umbilical fold.  The Veress needle was carefully introduced into the peritoneal cavity through the abdominal wall.  Intraperitoneal placement was confirmed by drop in intraabdominal pressure with insufflation of carbon dioxide gas.  Adequate pneumoperitoneum was obtained, and the 10/11 XL trocar and sleeve were then advanced without difficulty into the abdomen where intraabdominal placement was confirmed by the laparoscope. A survey of the patient's pelvis and abdomen revealed the above finddings.   Bilateral lower quadrant ports 10/11XL were then placed under direct visualization.  The pelvis was then carefully examined and washings were obtained.   The left ovary was removed first so it could be sent for frozen section.  The infundibulopelvic and uteroovarian ligaments were transected using the Harmonic scalpel.  The left lower quadrant port was expanded and a 15 mm trocar was place.  The large endopouch was inserted into the abdomen and the left ovary and cyst were placed into the Endopouch.  A needle and syringe was used to decompress the ovary and simple approximately 130 cc of clear fluid was added to the pathology specimen.  The left ovary was removed from the 15 mm port without difficulty.  The hysterectomy was then performed.  The left round ligament was then clamped and transected with the Harmonic device.  The leaves of the broad ligament were separated and serially transected. A bladder flap was created and the uterine artery was then skeletonized.  Attention was then turned to the right side of the pelvis where the same procedures were performed with conservation of the right ovary.    The ureters were noted to be safely away from the area of dissection.  At this point, attention was turned to the uterine vessels, which were clamped, coagulated and transected using the Harmonic device.   The uterus  appeared devascularized, and the fundus was divided from the cervix  with the Harmonic device, superior to the transected vessels and above the arch of the uterosacral ligaments.   The endocervical tissue was then systemically desiccated.  Surgicel was placed over the cuff.  Good hemostasis was noted.  The Gynecare Morcellex Tissue Morcellator was introduced into the pelvis through the left lower quadrant port under direct visualization. The specimen was grasped with a tenaculum and was brought to the tip of morcellator and morcellation of the entire specimen was carried out without difficulty.  All significant tissue fragments were removed, and the pelvis was irrigated. Good hemostasis was noted.  The right ovary was wrapped with Interceed.    The expanded left lower quadrant fascial incision was reapproximated with 0 Vicryl and the fascial closure device.  All trocars were removed under direct visualization, and the abdomen which was desufflated.  All skin incisions were closed with Dermabond/4-0 Vicryl subcuticular stitches. The patient tolerated the procedures well.  All instruments, needles, and sponge counts were correct x 2. The patient was taken to the recovery room awake, extubated and in stable condition.

## 2011-09-16 NOTE — Anesthesia Preprocedure Evaluation (Signed)
Anesthesia Evaluation  Patient identified by MRN, date of birth, ID band Patient awake    Reviewed: Allergy & Precautions, H&P , Patient's Chart, lab work & pertinent test results, reviewed documented beta blocker date and time   History of Anesthesia Complications Negative for: history of anesthetic complications  Airway Mallampati: II TM Distance: >3 FB Neck ROM: full    Dental No notable dental hx.    Pulmonary neg pulmonary ROS,  breath sounds clear to auscultation  Pulmonary exam normal       Cardiovascular Exercise Tolerance: Good negative cardio ROS  Rhythm:regular Rate:Normal     Neuro/Psych  Headaches, PSYCHIATRIC DISORDERS Depression negative neurological ROS  negative psych ROS   GI/Hepatic negative GI ROS, Neg liver ROS,   Endo/Other  negative endocrine ROS  Renal/GU negative Renal ROS     Musculoskeletal   Abdominal   Peds  Hematology negative hematology ROS (+)   Anesthesia Other Findings   Reproductive/Obstetrics negative OB ROS                           Anesthesia Physical Anesthesia Plan  ASA: II  Anesthesia Plan: General ETT   Post-op Pain Management:    Induction:   Airway Management Planned:   Additional Equipment:   Intra-op Plan:   Post-operative Plan:   Informed Consent: I have reviewed the patients History and Physical, chart, labs and discussed the procedure including the risks, benefits and alternatives for the proposed anesthesia with the patient or authorized representative who has indicated his/her understanding and acceptance.   Dental Advisory Given  Plan Discussed with: CRNA and Surgeon  Anesthesia Plan Comments:         Anesthesia Quick Evaluation

## 2011-09-16 NOTE — H&P (Signed)
Becky Friedman is an 36 y.o. female with mildly complex Left ovarian cyst, metrorrhagia and dysmenorrhea.  CA 125 is negative.  BRCA negative (mother has history of breast cancer).  Pt has had irrgular bleeding and dysmenorrhea for several years.  Pt would like definitive surgical management.  She refused hormonal treatment and ablation.  Pertinent Gynecological History:  Bleeding: intermenstrual bleeding and dysfunctional uterine bleeding Contraception: tubal ligation DES exposure: denies Sexually transmitted diseases: no past history Previous GYN Procedures: BTL  Last pap: normal Date: 2012     Past Medical History  Diagnosis Date  . Depression   . Headache     Past Surgical History  Procedure Date  . Tubal ligation   . Left shoulder torn ligament repair   . Laproscopic right knee      removed old scar tissue.    Family History  Problem Relation Age of Onset  . Cancer Mother     Breast Cancer  . Diabetes Paternal Grandmother     type 1  . Vision loss Paternal Grandmother     Social History:  reports that she has been smoking Cigarettes.  She has been smoking about .5 packs per day. She does not have any smokeless tobacco history on file. She reports that she drinks alcohol. She reports that she does not use illicit drugs.  Allergies: No Known Allergies  Prescriptions prior to admission  Medication Sig Dispense Refill  . SUMAtriptan (IMITREX) 100 MG tablet Take 1 tablet (100 mg total) by mouth every 2 (two) hours as needed for migraine (do not exceed 2 doses in a 24 hr peroid.).  10 tablet  0  . traMADol (ULTRAM) 50 MG tablet Take 50 mg by mouth every 6 (six) hours as needed. For cramps        Review of Systems  Constitutional: Negative.   Respiratory: Negative.   Cardiovascular: Negative.   Gastrointestinal: Negative.   Genitourinary: Negative.   Musculoskeletal: Negative.   Psychiatric/Behavioral: Negative.     Blood pressure 117/60, pulse 63, temperature 98.1  F (36.7 C), temperature source Oral, resp. rate 18, SpO2 100.00%. Physical Exam  Vitals reviewed. Constitutional: She is oriented to person, place, and time. She appears well-developed and well-nourished. No distress.  HENT:  Head: Normocephalic and atraumatic.  Neck: Neck supple. No thyromegaly present.  Cardiovascular: Normal rate and regular rhythm.   Respiratory: Breath sounds normal.  GI: Soft. She exhibits no distension. There is no tenderness. There is no rebound and no guarding.  Genitourinary: Vagina normal and uterus normal.       Left adnexal mass consistent with results on [place] (done as last exam in office).  Will repeat in OR.  Musculoskeletal: Normal range of motion.  Neurological: She is alert and oriented to person, place, and time.  Skin: Skin is warm and dry.  Psychiatric: She has a normal mood and affect. Her behavior is normal.    Results for orders placed during the hospital encounter of 09/16/11 (from the past 24 hour(s))  PREGNANCY, URINE     Status: Normal   Collection Time   09/16/11  9:16 AM      Component Value Range   Preg Test, Ur NEGATIVE  NEGATIVE    No results found.  Assessment/Plan: 36 year old female with left  ovarian cyst, metrorrhagia,a dysmenorrhea for laparoscopic supracervical hysterectomy, left salpingo oophorectomy.   Patient consented for procedure with risks include but not limited to bleeding requiring a blood transfusion, infection, damage to intraabdominal  organs (bowel, bladder, ureters), DVT, pulmonary embolism, anesthetic complications.  Possible need for laparotomy and removal of right ovary if there are abnormalities.  Shani Fitch H. 09/16/2011, 9:53 AM

## 2011-09-16 NOTE — Transfer of Care (Signed)
Immediate Anesthesia Transfer of Care Note  Patient: Becky Friedman  Procedure(s) Performed: Procedure(s) (LRB): LAPAROSCOPIC SUPRACERVICAL HYSTERECTOMY (N/A) SALPINGO OOPHERECTOMY (Left)  Patient Location: PACU  Anesthesia Type: General  Level of Consciousness: awake, alert  and oriented  Airway & Oxygen Therapy: Patient Spontanous Breathing and Patient connected to nasal cannula oxygen  Post-op Assessment: Report given to PACU RN and Post -op Vital signs reviewed and stable  Post vital signs: stable  Complications: No apparent anesthesia complications

## 2011-09-16 NOTE — Anesthesia Postprocedure Evaluation (Signed)
Anesthesia Post Note  Patient: Becky Friedman  Procedure(s) Performed: Procedure(s) (LRB): LAPAROSCOPIC SUPRACERVICAL HYSTERECTOMY (N/A) SALPINGO OOPHERECTOMY (Left)  Anesthesia type: GA  Patient location: PACU  Post pain: Pain level controlled  Post assessment: Post-op Vital signs reviewed  Last Vitals:  Filed Vitals:   09/16/11 1245  BP: 102/55  Pulse: 57  Temp:   Resp: 18    Post vital signs: Reviewed  Level of consciousness: sedated  Complications: No apparent anesthesia complications

## 2011-09-17 ENCOUNTER — Encounter (HOSPITAL_COMMUNITY): Payer: Self-pay | Admitting: Obstetrics & Gynecology

## 2011-09-17 DIAGNOSIS — G43909 Migraine, unspecified, not intractable, without status migrainosus: Secondary | ICD-10-CM

## 2011-09-17 DIAGNOSIS — N921 Excessive and frequent menstruation with irregular cycle: Secondary | ICD-10-CM

## 2011-09-17 DIAGNOSIS — N83202 Unspecified ovarian cyst, left side: Secondary | ICD-10-CM

## 2011-09-17 LAB — CBC
Hemoglobin: 11 g/dL — ABNORMAL LOW (ref 12.0–15.0)
RBC: 3.48 MIL/uL — ABNORMAL LOW (ref 3.87–5.11)

## 2011-09-17 MED ORDER — IBUPROFEN 600 MG PO TABS: 600.0000 mg | ORAL_TABLET | Freq: Four times a day (QID) | ORAL | Status: AC | PRN

## 2011-09-17 MED ORDER — OXYCODONE-ACETAMINOPHEN 5-325 MG PO TABS: 1.0000 | ORAL_TABLET | ORAL | Status: AC | PRN

## 2011-09-17 NOTE — Discharge Summary (Signed)
Physician Discharge Summary  Patient ID: Dossie Ocanas MRN: [id number] DOB/AGE: 1975-10-30 36 y.o.  Admit date: 09/16/2011 Discharge date: 09/17/2011  Admission Diagnoses: 36 year old female with left ovarian cyst, metrorrhagia, and dysmenorrhea  Discharge Diagnoses:  36 year old female with left ovarian cyst, metrorrhagia, and dysmenorrhea.  Cyst benign on frozen section.  Active Problems:  Migraine  Ovarian cyst, left  Metrorrhagia   Discharged Condition: good  Hospital Course: Talyn Eddie is a 36 year old admitted for scheduled surgery.  She underwent the procedures as mentioned above, her operation was uncomplicated. For further details about surgery, please refer to the operative report. Patient had an uncomplicated postoperative course. By time of discharge, her pain was controlled on oral pain medications; she was ambulating, voiding without difficulty, tolerating regular diet and passing flatus. She was deemed stable for discharge to home.   Consults: None  Significant Diagnostic Studies: CBC    Component Value Date/Time   WBC 13.3* 09/17/2011 0525   RBC 3.48* 09/17/2011 0525   HGB 11.0* 09/17/2011 0525   HCT 33.0* 09/17/2011 0525   PLT 256 09/17/2011 0525   MCV 94.8 09/17/2011 0525   MCH 31.6 09/17/2011 0525   MCHC 33.3 09/17/2011 0525   RDW 12.3 09/17/2011 0525      Treatments: surgery: LSH, LSO  Discharge Exam: Blood pressure 87/57, pulse 81, temperature 97.9 F (36.6 C), temperature source Oral, resp. rate 18, height 5\' 2"  (1.575 m), weight 61.236 kg (135 lb), SpO2 100.00%. General appearance: alert, cooperative and no distress Resp: clear to auscultation bilaterally Cardio: regular rate and rhythm GI: soft, non-tender; bowel sounds normal; no masses,  no organomegaly Extremities: no edema, redness or tenderness in the calves or thighs  Disposition: Home  Medication List  As of 09/17/2011  7:46 AM   STOP taking these medications         traMADol 50 MG tablet           TAKE these medications         ibuprofen 600 MG tablet   Commonly known as: ADVIL,MOTRIN   Take 1 tablet (600 mg total) by mouth every 6 (six) hours as needed for pain.      oxyCODONE-acetaminophen 5-325 MG per tablet   Commonly known as: PERCOCET/ROXICET   Take 1-2 tablets by mouth every 4 (four) hours as needed for pain.      SUMAtriptan 100 MG tablet   Commonly known as: IMITREX   Take 1 tablet (100 mg total) by mouth every 2 (two) hours as needed for migraine (do not exceed 2 doses in a 24 hr peroid.).           Follow up in 2 weeks at [facility].  SignedLesly Dukes. 09/17/2011, 7:46 AM

## 2011-09-17 NOTE — Progress Notes (Signed)
Pt discharged to home with friend.  Condition stable.  Pt ambulated to car with K. Kilfoyle, NT.  No equipment for home ordered at discharge.

## 2011-09-17 NOTE — Anesthesia Postprocedure Evaluation (Signed)
  Anesthesia Post-op Note  Patient: Becky Friedman  Procedure(s) Performed: Procedure(s) (LRB): LAPAROSCOPIC SUPRACERVICAL HYSTERECTOMY (N/A) SALPINGO OOPHERECTOMY (Left)  Patient Location: PACU and [facility]  Anesthesia Type: General  Level of Consciousness: awake, alert  and oriented  Airway and Oxygen Therapy: Patient Spontanous Breathing  Post-op Pain: mild  Post-op Assessment: Patient's Cardiovascular Status Stable, Respiratory Function Stable, No signs of Nausea or vomiting and Pain level controlled  Post-op Vital Signs: stable  Complications: No apparent anesthesia complications

## 2011-09-17 NOTE — Addendum Note (Signed)
Addendum  created 09/17/11 1707 by Elbert Ewings, CRNA   Modules edited:Notes Section

## 2011-09-17 NOTE — Progress Notes (Signed)
1 Day Post-Op Procedure(s) (LRB): LAPAROSCOPIC SUPRACERVICAL HYSTERECTOMY (N/A) SALPINGO OOPHERECTOMY (Left)  Subjective: Patient reports incisional pain, tolerating PO and + flatus.    Objective: I have reviewed patient's vital signs, intake and output, medications, labs and pathology.  General: alert, cooperative and no distress Resp: clear to auscultation bilaterally Cardio: regular rate and rhythm GI: soft, non-tender; bowel sounds normal; no masses,  no organomegaly Extremities: no edema, redness or tenderness in the calves or thighs Vaginal Bleeding: minimal  Assessment: s/p Procedure(s) (LRB): LAPAROSCOPIC SUPRACERVICAL HYSTERECTOMY (N/A) SALPINGO OOPHERECTOMY (Left): stable, progressing well and tolerating diet  Plan: Encourage ambulation Advance to PO medication Discontinue IV fluids Discharge home  LOS: 1 day    Geddy Boydstun H. 09/17/2011, 7:38 AM

## 2011-09-23 ENCOUNTER — Encounter: Payer: Self-pay | Admitting: Physician Assistant

## 2011-10-03 ENCOUNTER — Encounter: Payer: Self-pay | Admitting: Obstetrics & Gynecology

## 2011-10-03 ENCOUNTER — Ambulatory Visit (INDEPENDENT_AMBULATORY_CARE_PROVIDER_SITE_OTHER): Payer: 59 | Admitting: Obstetrics & Gynecology

## 2011-10-03 VITALS — BP 104/69 | HR 86 | Ht 62.0 in | Wt 128.0 lb

## 2011-10-03 DIAGNOSIS — Z09 Encounter for follow-up examination after completed treatment for conditions other than malignant neoplasm: Secondary | ICD-10-CM

## 2011-10-03 DIAGNOSIS — Z9889 Other specified postprocedural states: Secondary | ICD-10-CM

## 2011-10-03 NOTE — Progress Notes (Signed)
Routine post op.

## 2011-10-03 NOTE — Progress Notes (Signed)
  Subjective:    Patient ID: Becky Friedman, female    DOB: 06/11/1975, 36 y.o.   MRN: [id number]  HPI Pt is s/p laparoscopic hysterectomy and right salpingooophrectomy.  All pathology returned benign.  Pt has had steady improvement in soreness and activity.  Right port hurts more than left port.  All prts are healed well.  Pt had bleeding about 4 days post op but none now.       Review of Systems  Constitutional: Negative.   Respiratory: Negative.   Cardiovascular: Negative.   Gastrointestinal: Negative.   Genitourinary: Negative.        Objective:   Physical Exam  Vitals reviewed. Constitutional: She is oriented to person, place, and time. She appears well-developed and well-nourished.  HENT:  Head: Normocephalic and atraumatic.  Pulmonary/Chest: Effort normal.  Abdominal: Soft. She exhibits no distension and no mass. There is no tenderness. There is no rebound and no guarding.  Genitourinary: Vagina normal. No vaginal discharge found.       Right ovary mobile and nl and NT.  Cervical stump is NT.    Musculoskeletal: She exhibits no edema and no tenderness.  Neurological: She is alert and oriented to person, place, and time.  Skin: Skin is warm and dry.  Psychiatric: She has a normal mood and affect. Her behavior is normal.          Assessment & Plan:  Pt is s/p laparoscopic hysterectomy and right salpingooophrectomy.   Doing well Return to work light duty for 2 weeks. Resume sex in 3 weeks. Smoking cessation encouraged.

## 2011-10-04 ENCOUNTER — Encounter: Payer: Self-pay | Admitting: *Deleted

## 2013-06-22 IMAGING — MG MM DIAGNOSTIC BILATERAL
5 series · 5 of 5 positions shown · non-contrast
Comparison: 08/28/2011

***ADDENDUM*** CREATED: 09/12/2011 [DATE]
CLINICAL DATA: The patient returns after screening study for
evaluation of the the patient returns after screening study for
evaluation of both breasts.

DIGITAL DIAGNOSTIC BILATERAL MAMMOGRAM  AND BILATERAL BREAST
ULTRASOUND:

[R CC]
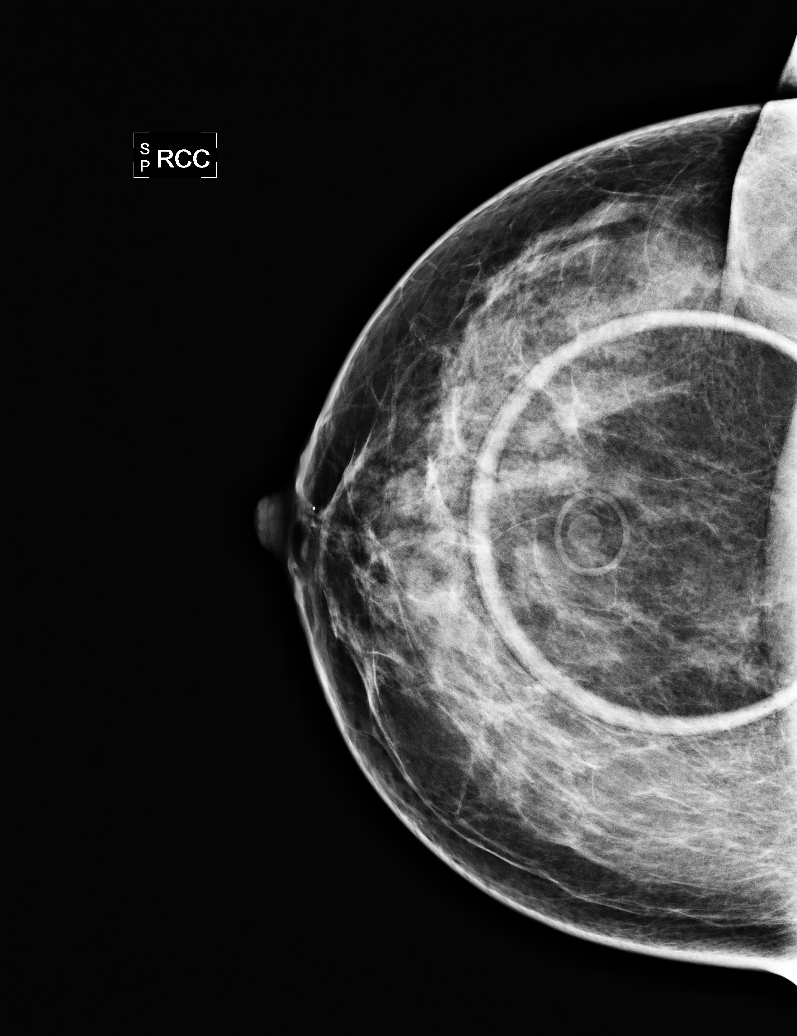

[L CC]
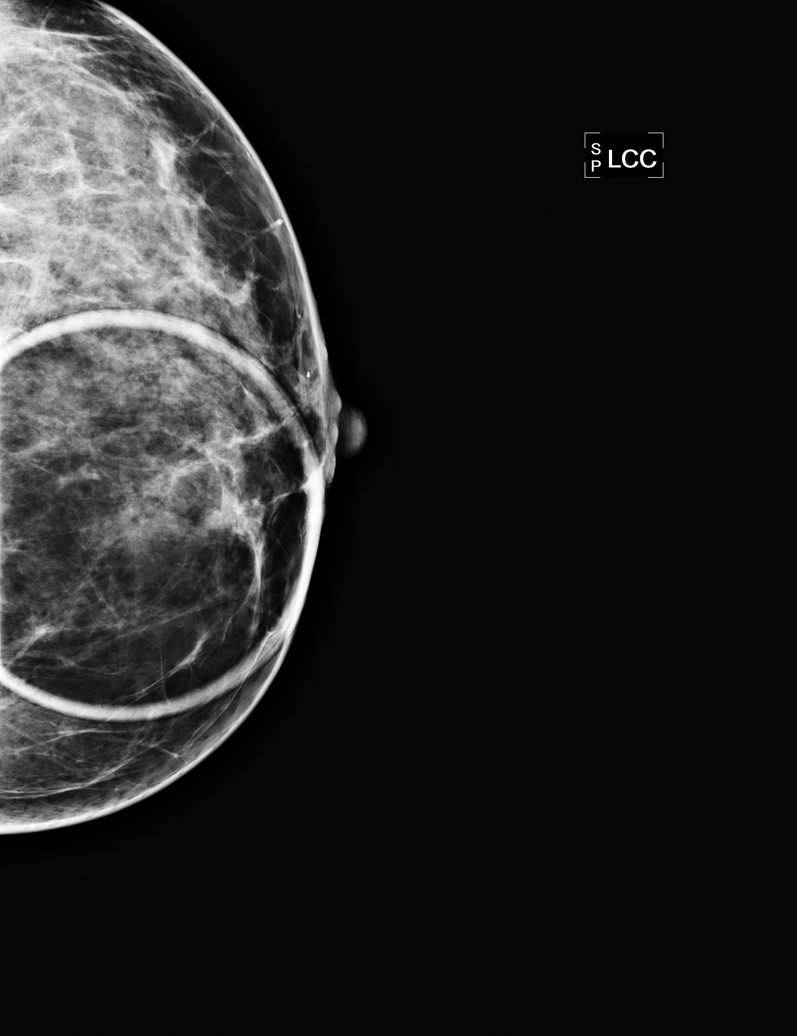

[L MLO]
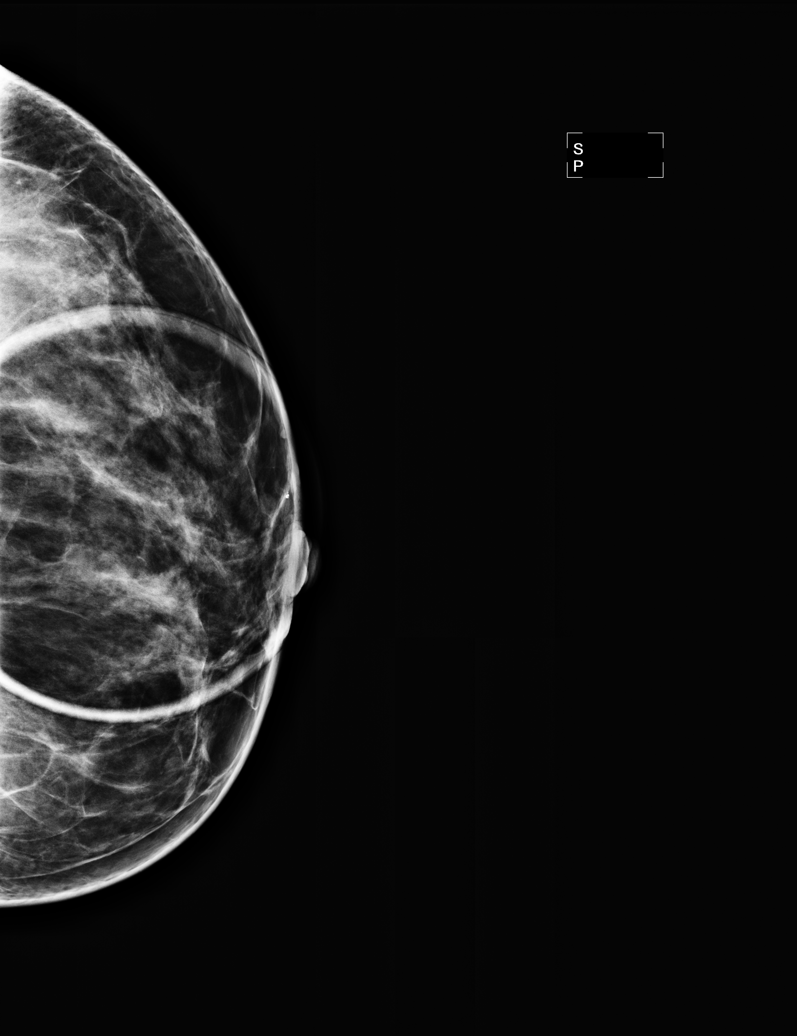

[R MLO (1 of 2)]
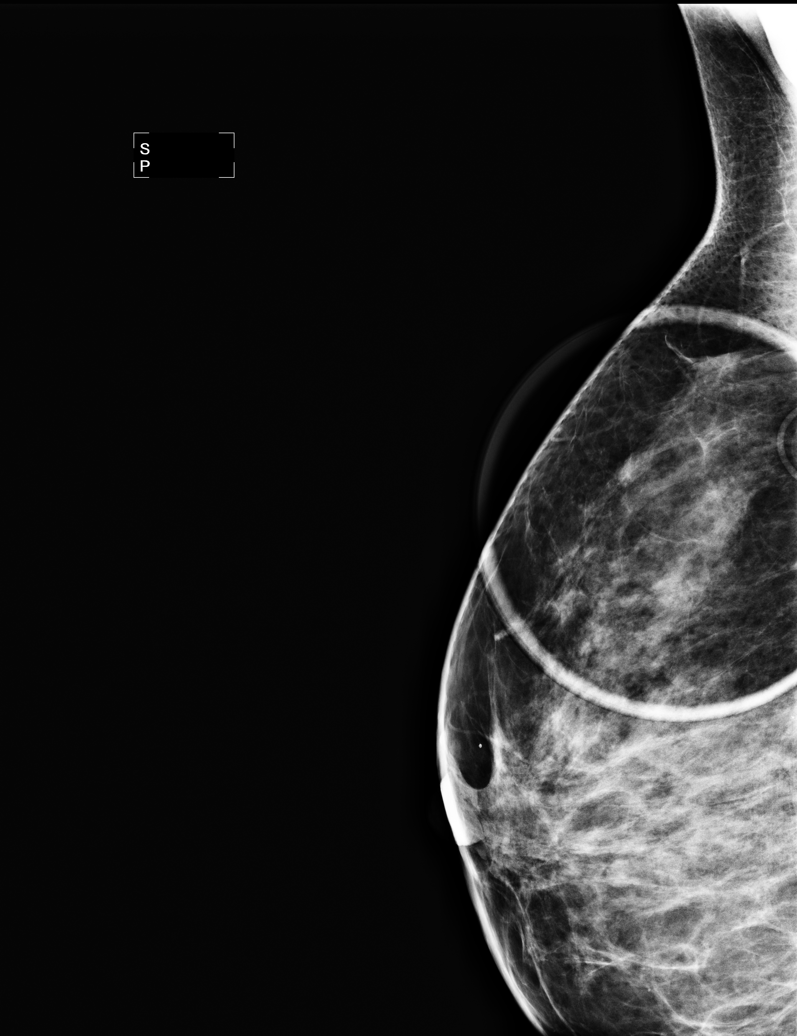

[R MLO (2 of 2)]
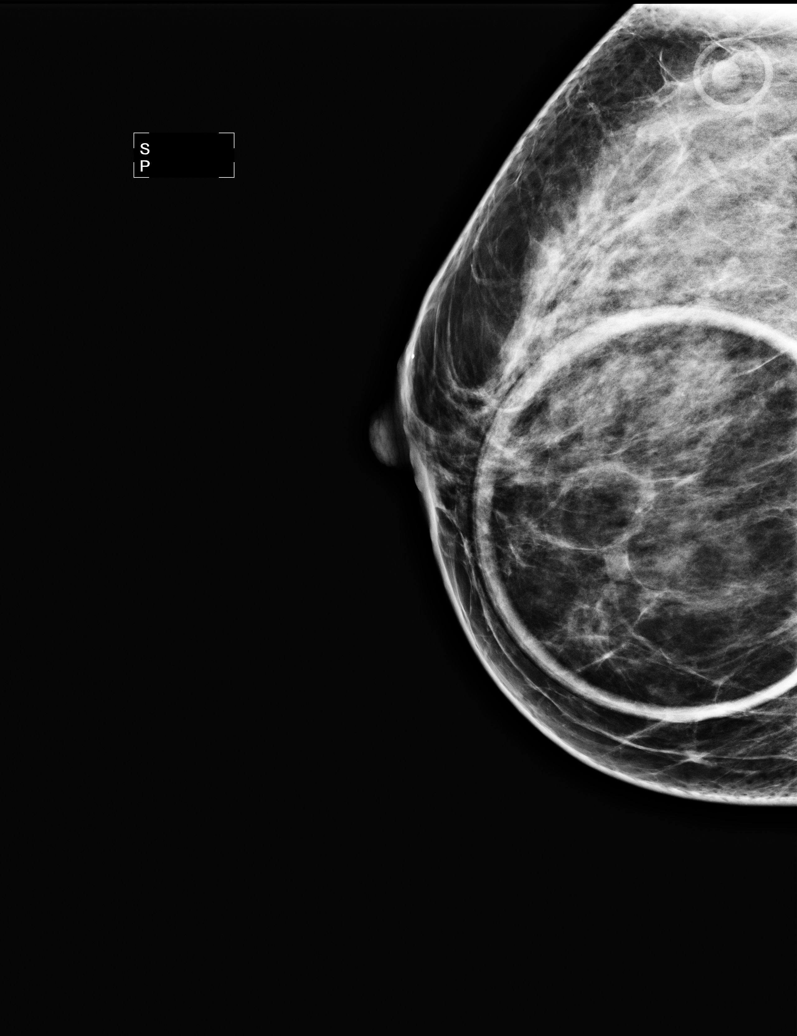

[5 of 5 positions shown; findings below may reference images not displayed]

FINDINGS: The comparison films are now available from [REDACTED] dated 05/15/2010, 05/12/2009, 05/06/2008. On the most recent
exam, asymmetry just medial to the left nipple appears slightly
more prominent and now has been biopsied, showing fibrocystic
changes.  No other changes are seen to indicate presence of
malignancy.
IMPRESSION: Asymmetry just medial to the left nipple, new since older exams.

RECOMMENDATION:
Biopsy (already performed).

BI-RADS CATEGORY 4:  Suspicious abnormality - biopsy should be
considered.
FINDINGS: Spot compression views confirm presence of nodularity in
the upper and lower central portions of the right breast.  There is
asymmetric density in the medial subareolar region of the left
breast confirm a spot compression views.

On physical exam, I palpate no abnormality within the upper or
lower central portions of the right breast.  I palpate no
abnormality in the medial subareolar region of the left breast.

Ultrasound is performed, showing multiple cysts within the right
breast, imaged in the 1 o'clock, 9 o'clock, 8 o'clock, and 7
o'clock locations.  No solid mass or acoustic shadowing identified
on the right.  Within the 10:30 o'clock location of the left breast
3 cm from nipple, there is an intraductal filling defect which
measures approximate 9 mm in diameter.  This does demonstrate blood
flow on Doppler evaluation.  This is felt likely to be a papilloma.
Biopsy is suggested.
IMPRESSION: 1.  Multiple simple cysts on the right.
2.  Intraductal solid mass in the 10:30 o'clock location of the
left breast which biopsy is recommended.  The patient is scheduled
for oophorectomy 1 week from today by Dr. Esser.

RECOMMENDATION:
Ultrasound guided core biopsy left breast.  This is performed on
the same day and dictated separately.

BI-RADS CATEGORY 4:  Suspicious abnormality - biopsy should be
considered.

## 2013-06-22 IMAGING — US US BREAST BILAT
1 series · 13 of 22 positions shown · non-contrast
Comparison: 08/28/2011

***ADDENDUM*** CREATED: 09/12/2011 [DATE]
CLINICAL DATA: The patient returns after screening study for
evaluation of the the patient returns after screening study for
evaluation of both breasts.

DIGITAL DIAGNOSTIC BILATERAL MAMMOGRAM  AND BILATERAL BREAST
ULTRASOUND:

[Series 1: us breast bilat · 13 of 22 slices shown]
[im 1/22]
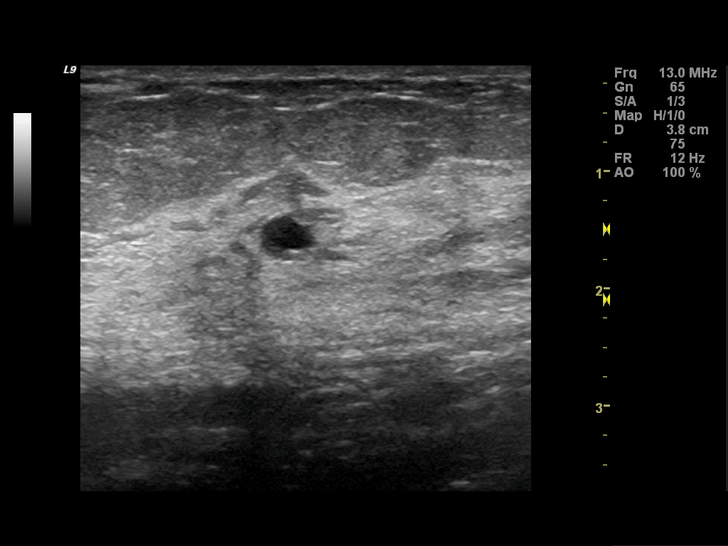
[im 3/22]
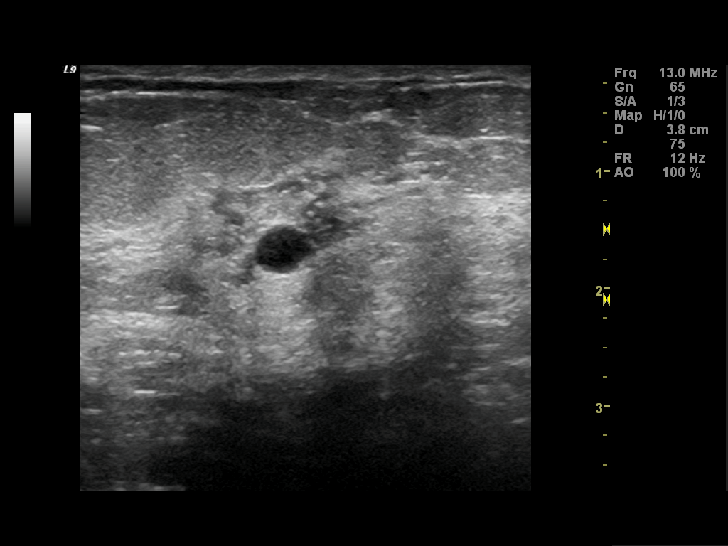
[im 5/22]
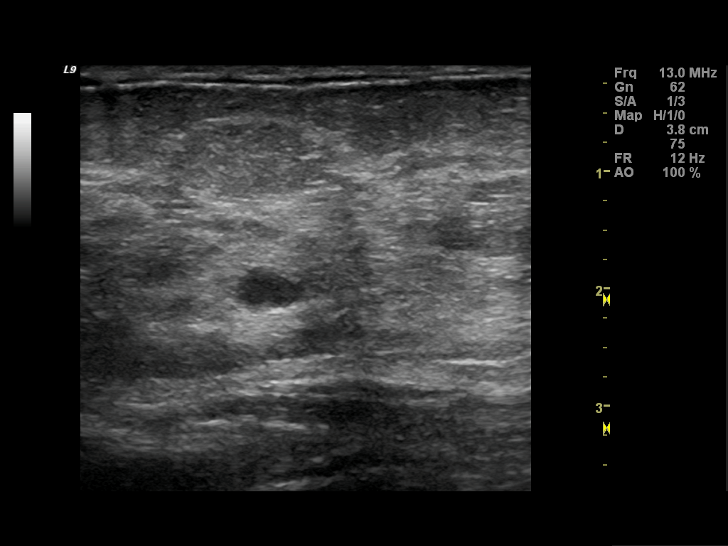
[im 6/22]
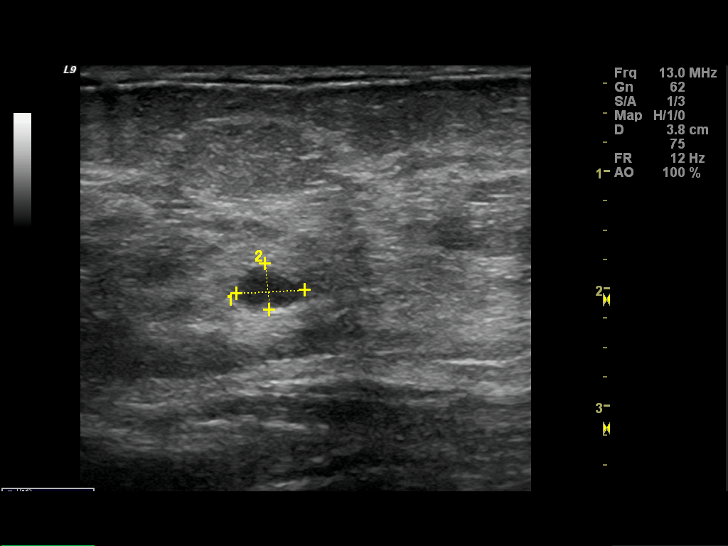
[im 8/22]
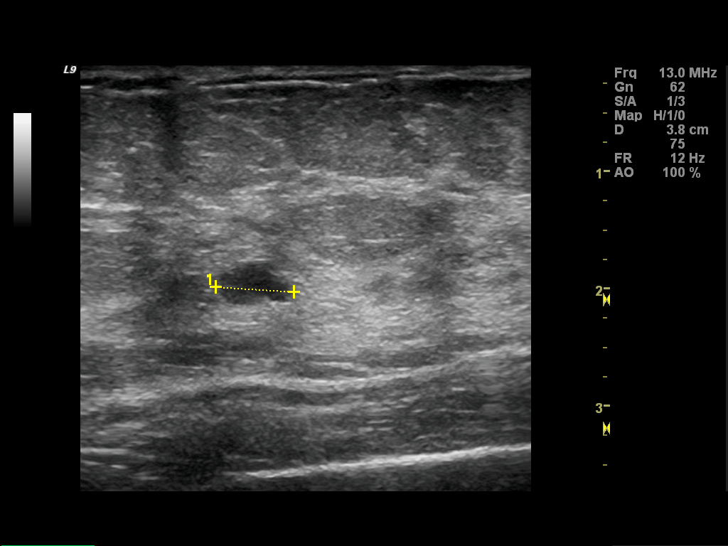
[im 10/22]
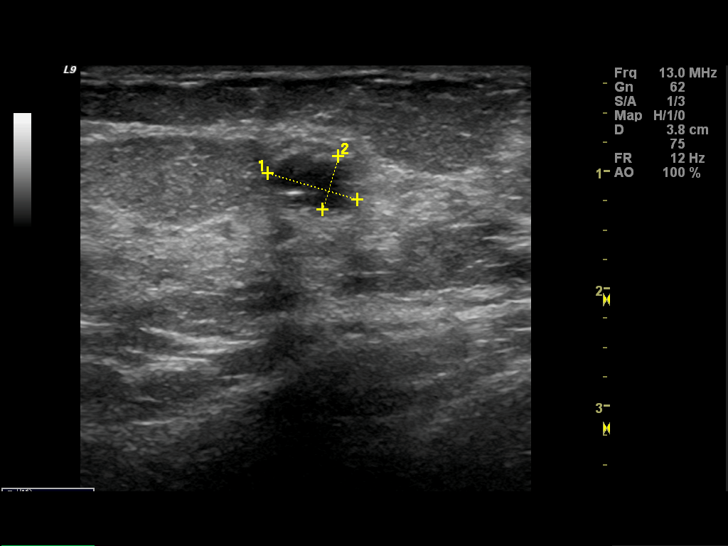
[im 12/22]
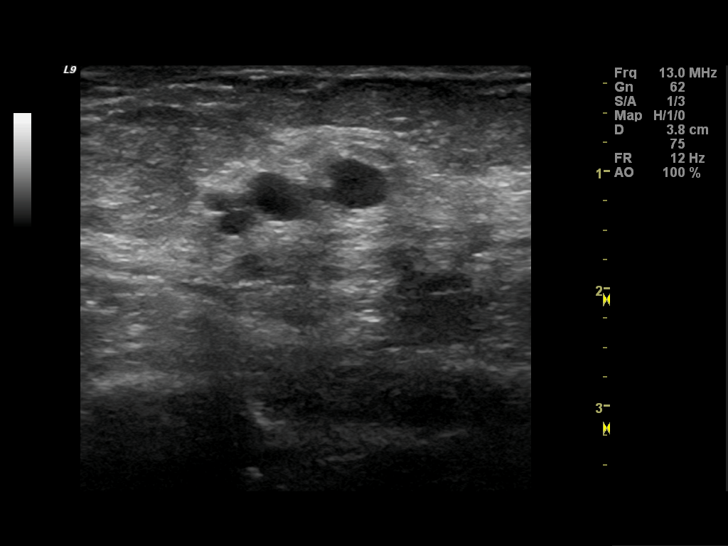
[im 13/22]
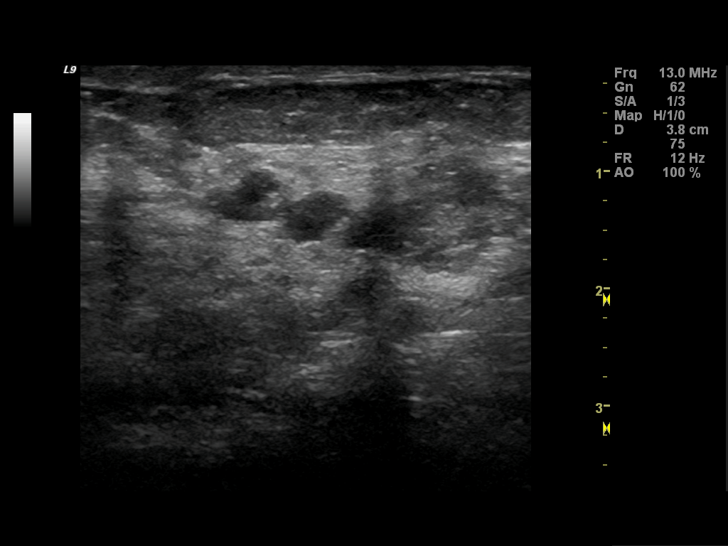
[im 15/22]
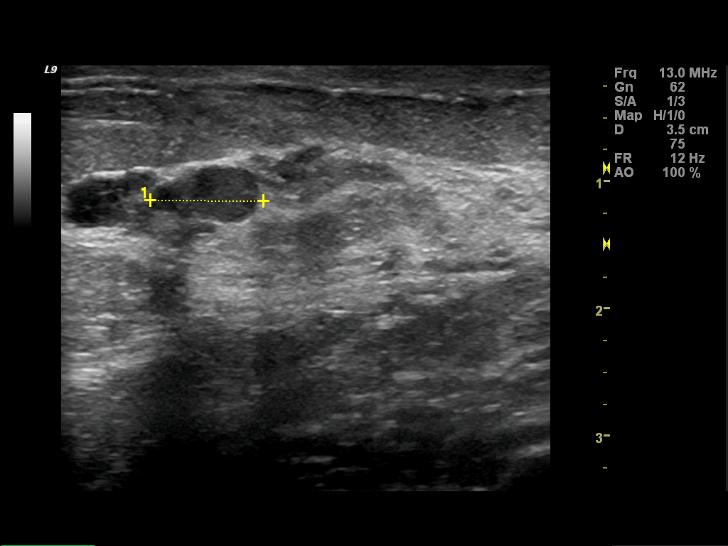
[im 17/22]
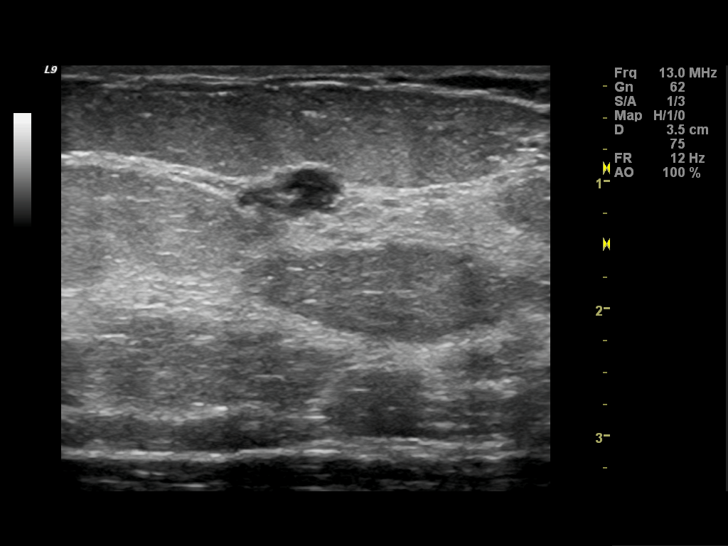
[im 18/22]
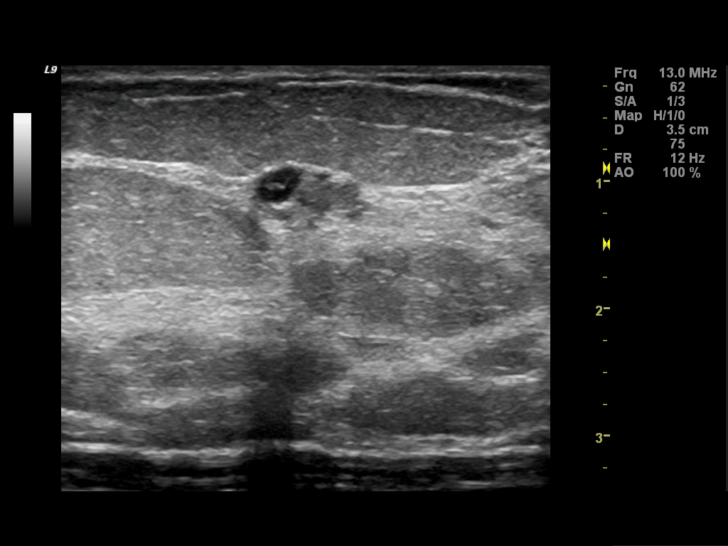
[im 20/22]
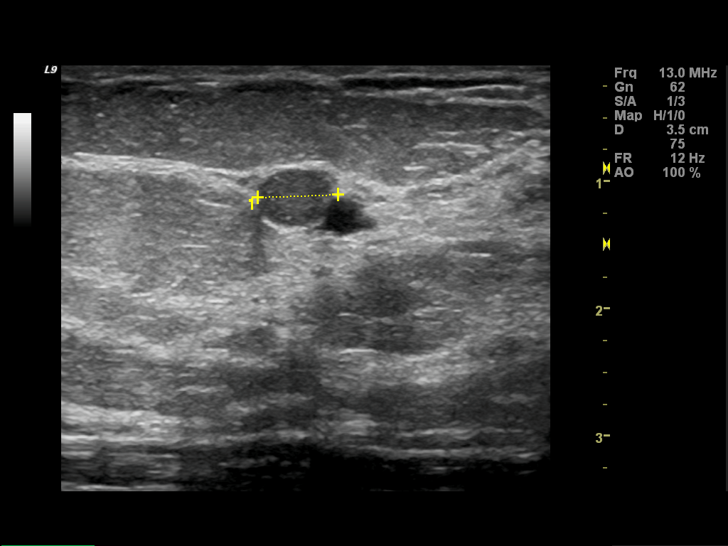
[im 22/22]
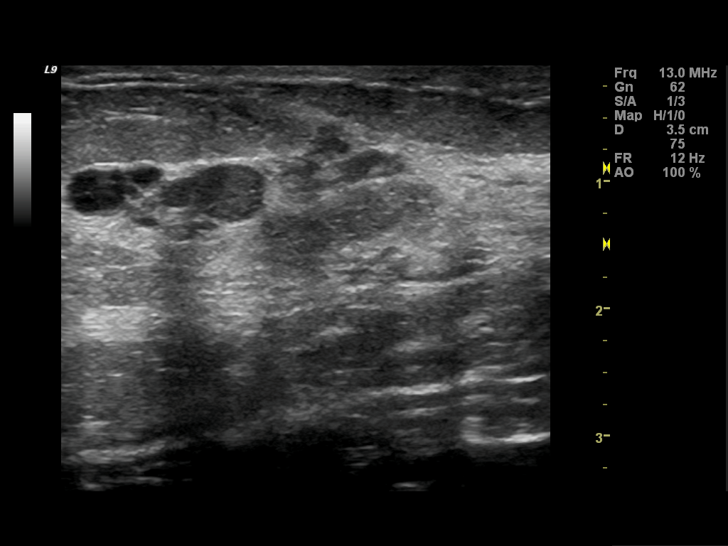

[13 of 22 positions shown; findings below may reference images not displayed]

FINDINGS: The comparison films are now available from [REDACTED] dated 05/15/2010, 05/12/2009, 05/06/2008. On the most recent
exam, asymmetry just medial to the left nipple appears slightly
more prominent and now has been biopsied, showing fibrocystic
changes.  No other changes are seen to indicate presence of
malignancy.
IMPRESSION: Asymmetry just medial to the left nipple, new since older exams.

RECOMMENDATION:
Biopsy (already performed).

BI-RADS CATEGORY 4:  Suspicious abnormality - biopsy should be
considered.
FINDINGS: Spot compression views confirm presence of nodularity in
the upper and lower central portions of the right breast.  There is
asymmetric density in the medial subareolar region of the left
breast confirm a spot compression views.

On physical exam, I palpate no abnormality within the upper or
lower central portions of the right breast.  I palpate no
abnormality in the medial subareolar region of the left breast.

Ultrasound is performed, showing multiple cysts within the right
breast, imaged in the 1 o'clock, 9 o'clock, 8 o'clock, and 7
o'clock locations.  No solid mass or acoustic shadowing identified
on the right.  Within the 10:30 o'clock location of the left breast
3 cm from nipple, there is an intraductal filling defect which
measures approximate 9 mm in diameter.  This does demonstrate blood
flow on Doppler evaluation.  This is felt likely to be a papilloma.
Biopsy is suggested.
IMPRESSION: 1.  Multiple simple cysts on the right.
2.  Intraductal solid mass in the 10:30 o'clock location of the
left breast which biopsy is recommended.  The patient is scheduled
for oophorectomy 1 week from today by Dr. Esser.

RECOMMENDATION:
Ultrasound guided core biopsy left breast.  This is performed on
the same day and dictated separately.

BI-RADS CATEGORY 4:  Suspicious abnormality - biopsy should be
considered.

## 2014-11-09 ENCOUNTER — Ambulatory Visit (INDEPENDENT_AMBULATORY_CARE_PROVIDER_SITE_OTHER): Payer: 59 | Admitting: Physician Assistant

## 2014-11-09 ENCOUNTER — Encounter: Payer: Self-pay | Admitting: Physician Assistant

## 2014-11-09 VITALS — BP 134/74 | HR 88 | Ht 62.0 in | Wt 135.0 lb

## 2014-11-09 DIAGNOSIS — R1012 Left upper quadrant pain: Secondary | ICD-10-CM | POA: Diagnosis not present

## 2014-11-09 DIAGNOSIS — K920 Hematemesis: Secondary | ICD-10-CM

## 2014-11-09 DIAGNOSIS — R1013 Epigastric pain: Secondary | ICD-10-CM

## 2014-11-09 DIAGNOSIS — G43001 Migraine without aura, not intractable, with status migrainosus: Secondary | ICD-10-CM | POA: Diagnosis not present

## 2014-11-09 DIAGNOSIS — F4323 Adjustment disorder with mixed anxiety and depressed mood: Secondary | ICD-10-CM | POA: Diagnosis not present

## 2014-11-09 MED ORDER — SUMATRIPTAN SUCCINATE 100 MG PO TABS: 100.0000 mg | ORAL_TABLET | ORAL | Status: DC | PRN

## 2014-11-09 MED ORDER — OMEPRAZOLE 40 MG PO CPDR: 40.0000 mg | DELAYED_RELEASE_CAPSULE | Freq: Every day | ORAL | Status: DC

## 2014-11-09 MED ORDER — CITALOPRAM HYDROBROMIDE 10 MG PO TABS: 10.0000 mg | ORAL_TABLET | Freq: Every day | ORAL | Status: AC

## 2014-11-09 MED ORDER — LORAZEPAM 0.5 MG PO TABS: 0.5000 mg | ORAL_TABLET | Freq: Three times a day (TID) | ORAL | Status: DC | PRN

## 2014-11-09 NOTE — Progress Notes (Addendum)
   Subjective:    Patient ID: Becky Friedman, female    DOB: March 14, 1975, 39 y.o.   MRN: [id number]  HPI Patient presents to clinic today with complaints of increased anxiety, H/A, and hematemesis. She reports she has been dealing with new stressors at home and work, and is experiencing increased anxiety. She has vomited several times within the last week, and reports the vomit is "streaked with blood". She is also experiencing nausea, loss of appetite, substernal pain, and increasing belching. She has had symptoms both with and without H/A. Her H/A are exacerbated by movement and bright light. She is experiencing them almost daily, and they typically last 4 hours. She has tried ibuprofen with no relief and Goody's powder with mild relief. She reports using Goody's powder several times per week.    Review of Systems Positive for symptoms listed in HPI    Objective:   Physical Exam Blood pressure 134/74, pulse 88, height 5\' 2"  (1.575 m), weight 135 lb (61.236 kg), last menstrual period 08/12/2011. Constitutional: Well nourished, well developed, appears under distress Cardiac: Normal S1, S2. No M/G/R Pulm: CTAB GI: + BS, abdomen tender to palpation in LUQ and epigastric area.        Assessment & Plan:  Hematemesis/LUQ pain/epigastric burning-  Symptoms concerning for PUD. discontinue all NSAIDS, especially Goody's powder. Order CBC evaluate for GI bleed. Perform H Pylori testing. Start omeprazole 40mg  daily. Discussed GERD diet. cmp to look at liver/gallbladder/kidneys.    Depression/axiety- rx 10 mg celexa tablet, refer to counseling. Discussed side effects of mediction. Follow up in 4-6 weeks. If depression/anxiety worsening please call office. Ativan was given for acute stress and anxiety. Addiction potential discussed. I do think that stress could be triggering migraines and possible PUD.   Migraine- rx 9 sumatriptan tablets, take 1 tablet prn for H/A. Patient declined prophylatic treatment  at this time. Keep HA diary.   Reviewed by Iran Planas PA-C

## 2014-11-09 NOTE — Patient Instructions (Signed)
Peptic Ulcer A peptic ulcer is a sore in the lining of your esophagus (esophageal ulcer), stomach (gastric ulcer), or in the first part of your small intestine (duodenal ulcer). The ulcer causes erosion into the deeper tissue. CAUSES  Normally, the lining of the stomach and the small intestine protects itself from the acid that digests food. The protective lining can be damaged by:  An infection caused by a bacterium called Helicobacter pylori (H. pylori).  Regular use of nonsteroidal anti-inflammatory drugs (NSAIDs), such as ibuprofen or aspirin.  Smoking tobacco. Other risk factors include being older than 50, drinking alcohol excessively, and having a family history of ulcer disease.  SYMPTOMS   Burning pain or gnawing in the area between the chest and the belly button.  Heartburn.  Nausea and vomiting.  Bloating. The pain can be worse on an empty stomach and at night. If the ulcer results in bleeding, it can cause:  Black, tarry stools.  Vomiting of bright red blood.  Vomiting of coffee-ground-looking materials. DIAGNOSIS  A diagnosis is usually made based upon your history and an exam. Other tests and procedures may be performed to find the cause of the ulcer. Finding a cause will help determine the best treatment. Tests and procedures may include:  Blood tests, stool tests, or breath tests to check for the bacterium H. pylori.  An upper gastrointestinal (GI) series of the esophagus, stomach, and small intestine.  An endoscopy to examine the esophagus, stomach, and small intestine.  A biopsy. TREATMENT  Treatment may include:  Eliminating the cause of the ulcer, such as smoking, NSAIDs, or alcohol.  Medicines to reduce the amount of acid in your digestive tract.  Antibiotic medicines if the ulcer is caused by the H. pylori bacterium.  An upper endoscopy to treat a bleeding ulcer.  Surgery if the bleeding is severe or if the ulcer created a hole somewhere in the  digestive system. HOME CARE INSTRUCTIONS   Avoid tobacco, alcohol, and caffeine. Smoking can increase the acid in the stomach, and continued smoking will impair the healing of ulcers.  Avoid foods and drinks that seem to cause discomfort or aggravate your ulcer.  Only take medicines as directed by your caregiver. Do not substitute over-the-counter medicines for prescription medicines without talking to your caregiver.  Keep any follow-up appointments and tests as directed. SEEK MEDICAL CARE IF:   Your do not improve within 7 days of starting treatment.  You have ongoing indigestion or heartburn. SEEK IMMEDIATE MEDICAL CARE IF:   You have sudden, sharp, or persistent abdominal pain.  You have bloody or dark black, tarry stools.  You vomit blood or vomit that looks like coffee grounds.  You become light-headed, weak, or feel faint.  You become sweaty or clammy. MAKE SURE YOU:   Understand these instructions.  Will watch your condition.  Will get help right away if you are not doing well or get worse. Document Released: 02/09/2000 Document Revised: 06/28/2013 Document Reviewed: 09/11/2011 Exit[employer] Patient Information 2015 [employer]. This information is not intended to replace advice given to you by your health care provider. Make sure you discuss any questions you have with your health care provider.  

## 2014-11-10 DIAGNOSIS — K920 Hematemesis: Secondary | ICD-10-CM | POA: Insufficient documentation

## 2014-11-10 LAB — COMPLETE METABOLIC PANEL WITH GFR
ALT: 8 U/L (ref 6–29)
AST: 11 U/L (ref 10–30)
Albumin: 4.5 g/dL (ref 3.6–5.1)
Alkaline Phosphatase: 81 U/L (ref 33–115)
BILIRUBIN TOTAL: 0.7 mg/dL (ref 0.2–1.2)
BUN: 12 mg/dL (ref 7–25)
CO2: 26 mmol/L (ref 20–31)
Calcium: 9.5 mg/dL (ref 8.6–10.2)
Chloride: 104 mmol/L (ref 98–110)
Creat: 0.78 mg/dL (ref 0.50–1.10)
GFR, Est African American: >89 mL/min (ref >=60)
GLUCOSE: 89 mg/dL (ref 65–99)
POTASSIUM: 3.8 mmol/L (ref 3.5–5.3)
SODIUM: 140 mmol/L (ref 135–146)
TOTAL PROTEIN: 7.2 g/dL (ref 6.1–8.1)

## 2014-11-10 LAB — CBC WITH DIFFERENTIAL/PLATELET
BASOS ABS: 0 10*3/uL (ref 0.0–0.1)
BASOS PCT: 0 % (ref 0–1)
EOS ABS: 0 10*3/uL (ref 0.0–0.7)
Eosinophils Relative: 0 % (ref 0–5)
HCT: 40.9 % (ref 36.0–46.0)
Hemoglobin: 13.8 g/dL (ref 12.0–15.0)
Lymphocytes Relative: 24 % (ref 12–46)
Lymphs Abs: 2.3 10*3/uL (ref 0.7–4.0)
MCH: 32.5 pg (ref 26.0–34.0)
MCHC: 33.7 g/dL (ref 30.0–36.0)
MCV: 96.2 fL (ref 78.0–100.0)
MONOS PCT: 6 % (ref 3–12)
MPV: 10.1 fL (ref 8.6–12.4)
Monocytes Absolute: 0.6 10*3/uL (ref 0.1–1.0)
NEUTROS PCT: 70 % (ref 43–77)
Neutro Abs: 6.6 10*3/uL (ref 1.7–7.7)
PLATELETS: 327 10*3/uL (ref 150–400)
RBC: 4.25 MIL/uL (ref 3.87–5.11)
RDW: 12.9 % (ref 11.5–15.5)
WBC: 9.4 10*3/uL (ref 4.0–10.5)

## 2014-11-10 NOTE — Addendum Note (Signed)
Addended by: Donella Stade on: 11/10/2014 11:33 PM   Modules accepted: Level of Service

## 2014-11-11 LAB — H. PYLORI BREATH TEST: H. PYLORI BREATH TEST: NOT DETECTED

## 2014-12-10 ENCOUNTER — Other Ambulatory Visit: Payer: Self-pay | Admitting: Physician Assistant

## 2014-12-13 ENCOUNTER — Encounter: Payer: Self-pay | Admitting: Physician Assistant

## 2014-12-13 ENCOUNTER — Ambulatory Visit (INDEPENDENT_AMBULATORY_CARE_PROVIDER_SITE_OTHER): Payer: 59 | Admitting: Physician Assistant

## 2014-12-13 VITALS — BP 105/51 | HR 67 | Ht 62.0 in | Wt 137.0 lb

## 2014-12-13 DIAGNOSIS — F329 Major depressive disorder, single episode, unspecified: Secondary | ICD-10-CM

## 2014-12-13 DIAGNOSIS — F4323 Adjustment disorder with mixed anxiety and depressed mood: Secondary | ICD-10-CM | POA: Diagnosis not present

## 2014-12-13 DIAGNOSIS — G43001 Migraine without aura, not intractable, with status migrainosus: Secondary | ICD-10-CM

## 2014-12-13 DIAGNOSIS — F32A Depression, unspecified: Secondary | ICD-10-CM

## 2014-12-13 MED ORDER — CITALOPRAM HYDROBROMIDE 20 MG PO TABS: 20.0000 mg | ORAL_TABLET | Freq: Every day | ORAL | Status: AC

## 2014-12-13 MED ORDER — SUMATRIPTAN SUCCINATE 100 MG PO TABS
100.0000 mg | ORAL_TABLET | ORAL | Status: AC | PRN
Start: 2014-12-13 — End: 2015-12-13

## 2014-12-13 MED ORDER — LORAZEPAM 0.5 MG PO TABS: 0.5000 mg | ORAL_TABLET | Freq: Three times a day (TID) | ORAL | Status: AC | PRN

## 2014-12-13 MED ORDER — TOPIRAMATE 25 MG PO TABS: 25.0000 mg | ORAL_TABLET | Freq: Two times a day (BID) | ORAL | Status: DC

## 2014-12-13 NOTE — Progress Notes (Signed)
   Subjective:    Patient ID: Becky Friedman, female    DOB: [birth year], 39 y.o.   MRN: [id number]  HPI Pt presents to the clinic for medication refill. She does feel like celexa is helping her mood. She is crying less and feels more hopeful. Still having some anxiety and sadness due to life situations. Would like to increase celexa today. Denies any suicidal or homicidal thoughts.   She has started to have more migraines. Increased to 1-2 a week. imitrex does help but can't take every day. She does think her stress, depression and anxiety contribute to headaches.    Review of Systems  All other systems reviewed and are negative.      Objective:   Physical Exam  Constitutional: She is oriented to person, place, and time. She appears well-developed and well-nourished.  Cardiovascular: Normal rate, regular rhythm and normal heart sounds.   Pulmonary/Chest: Effort normal and breath sounds normal.  Neurological: She is alert and oriented to person, place, and time.  Psychiatric: She has a normal mood and affect. Her behavior is normal.          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 PHQ-9 was 9. Increased celexa to 20mg  daily. Follow up in 3 months. Ativan was refilled for as needed usage.   Migraines- imitrex refilled for rescue. Started topamax for preventative. Discussed side effects.

## 2015-01-27 ENCOUNTER — Other Ambulatory Visit: Payer: Self-pay | Admitting: Physician Assistant

## 2015-03-15 ENCOUNTER — Ambulatory Visit: Payer: 59 | Admitting: Physician Assistant

## 2018-09-09 ENCOUNTER — Other Ambulatory Visit: Payer: Self-pay | Admitting: Physician Assistant

## 2018-09-09 DIAGNOSIS — Z20822 Contact with and (suspected) exposure to covid-19: Secondary | ICD-10-CM

## 2018-09-13 LAB — NOVEL CORONAVIRUS, NAA: SARS-CoV-2, NAA: NOT DETECTED
